# Patient Record
Sex: Female | Born: 1945 | Race: White | Hispanic: No | Marital: Married | Smoking: Current every day smoker
Health system: Southern US, Community
[De-identification: ages and names within clinical notes are randomized; demographics above are authoritative.]

## PROBLEM LIST (undated history)

## (undated) DIAGNOSIS — E079 Disorder of thyroid, unspecified: Secondary | ICD-10-CM

## (undated) DIAGNOSIS — E785 Hyperlipidemia, unspecified: Secondary | ICD-10-CM

## (undated) DIAGNOSIS — K219 Gastro-esophageal reflux disease without esophagitis: Secondary | ICD-10-CM

## (undated) DIAGNOSIS — Z8639 Personal history of other endocrine, nutritional and metabolic disease: Secondary | ICD-10-CM

## (undated) DIAGNOSIS — F419 Anxiety disorder, unspecified: Secondary | ICD-10-CM

## (undated) DIAGNOSIS — J449 Chronic obstructive pulmonary disease, unspecified: Secondary | ICD-10-CM

## (undated) HISTORY — DX: Disorder of thyroid, unspecified: E07.9

## (undated) HISTORY — DX: Personal history of other endocrine, nutritional and metabolic disease: Z86.39

## (undated) HISTORY — PX: VAGINAL HYSTERECTOMY: SUR661

## (undated) HISTORY — DX: Hyperlipidemia, unspecified: E78.5

## (undated) HISTORY — PX: EXPLORATORY LAPAROTOMY: SUR591

## (undated) HISTORY — DX: Anxiety disorder, unspecified: F41.9

## (undated) HISTORY — PX: AUGMENTATION MAMMAPLASTY: SUR837

---

## 1998-02-09 ENCOUNTER — Ambulatory Visit (HOSPITAL_COMMUNITY): Admission: RE | Admit: 1998-02-09 | Discharge: 1998-02-10 | Payer: Self-pay | Admitting: Specialist

## 1998-07-18 HISTORY — PX: CERVICAL SPINE SURGERY: SHX589

## 2004-01-01 ENCOUNTER — Encounter: Admission: RE | Admit: 2004-01-01 | Discharge: 2004-01-01 | Payer: Self-pay | Admitting: Family Medicine

## 2004-06-16 ENCOUNTER — Ambulatory Visit: Payer: Self-pay | Admitting: Family Medicine

## 2004-06-21 ENCOUNTER — Ambulatory Visit: Payer: Self-pay | Admitting: Family Medicine

## 2004-06-26 ENCOUNTER — Ambulatory Visit: Payer: Self-pay | Admitting: Family Medicine

## 2004-11-01 ENCOUNTER — Ambulatory Visit: Payer: Self-pay | Admitting: Family Medicine

## 2005-03-30 ENCOUNTER — Ambulatory Visit: Payer: Self-pay | Admitting: Family Medicine

## 2005-08-23 ENCOUNTER — Ambulatory Visit: Payer: Self-pay | Admitting: Family Medicine

## 2005-08-31 ENCOUNTER — Ambulatory Visit: Payer: Self-pay | Admitting: Family Medicine

## 2005-11-16 ENCOUNTER — Ambulatory Visit: Payer: Self-pay | Admitting: Family Medicine

## 2007-02-13 DIAGNOSIS — E039 Hypothyroidism, unspecified: Secondary | ICD-10-CM | POA: Insufficient documentation

## 2007-02-26 ENCOUNTER — Ambulatory Visit: Payer: Self-pay | Admitting: Family Medicine

## 2007-02-28 LAB — CONVERTED CEMR LAB
ALT: 12 units/L (ref 0–35)
AST: 18 units/L (ref 0–37)
Albumin: 4.1 g/dL (ref 3.5–5.2)
Alkaline Phosphatase: 55 units/L (ref 39–117)
Basophils Absolute: 0 10*3/uL (ref 0.0–0.1)
Bilirubin, Direct: 0.1 mg/dL (ref 0.0–0.3)
Direct LDL: 144.2 mg/dL
Eosinophils Absolute: 0.5 10*3/uL (ref 0.0–0.6)
HCT: 36.6 % (ref 36.0–46.0)
Hemoglobin: 12.8 g/dL (ref 12.0–15.0)
Monocytes Absolute: 0.8 10*3/uL — ABNORMAL HIGH (ref 0.2–0.7)
Neutro Abs: 4 10*3/uL (ref 1.4–7.7)
Neutrophils Relative %: 40.9 % — ABNORMAL LOW (ref 43.0–77.0)
Platelets: 245 10*3/uL (ref 150–400)
RBC: 3.92 M/uL (ref 3.87–5.11)
RDW: 11.7 % (ref 11.5–14.6)
Total Bilirubin: 1.1 mg/dL (ref 0.3–1.2)
Total CHOL/HDL Ratio: 4.6
Total Protein: 6.3 g/dL (ref 6.0–8.3)
Triglycerides: 88 mg/dL (ref 0–149)
VLDL: 18 mg/dL (ref 0–40)

## 2007-07-11 ENCOUNTER — Ambulatory Visit: Payer: Self-pay | Admitting: Internal Medicine

## 2007-07-18 ENCOUNTER — Ambulatory Visit: Payer: Self-pay | Admitting: Family Medicine

## 2007-08-31 ENCOUNTER — Telehealth: Payer: Self-pay | Admitting: Family Medicine

## 2008-01-02 ENCOUNTER — Ambulatory Visit: Payer: Self-pay | Admitting: Family Medicine

## 2008-01-04 LAB — CONVERTED CEMR LAB: TSH: 0.1 microintl units/mL — ABNORMAL LOW (ref 0.35–5.50)

## 2008-01-25 ENCOUNTER — Telehealth: Payer: Self-pay | Admitting: Family Medicine

## 2008-04-21 ENCOUNTER — Ambulatory Visit: Payer: Self-pay | Admitting: Family Medicine

## 2009-01-26 ENCOUNTER — Ambulatory Visit: Payer: Self-pay | Admitting: Family Medicine

## 2009-01-26 DIAGNOSIS — N318 Other neuromuscular dysfunction of bladder: Secondary | ICD-10-CM | POA: Insufficient documentation

## 2009-01-26 DIAGNOSIS — G4700 Insomnia, unspecified: Secondary | ICD-10-CM | POA: Insufficient documentation

## 2009-01-28 LAB — CONVERTED CEMR LAB
ALT: 10 units/L (ref 0–35)
AST: 17 units/L (ref 0–37)
BUN: 9 mg/dL (ref 6–23)
Bilirubin, Direct: 0.1 mg/dL (ref 0.0–0.3)
CO2: 31 meq/L (ref 19–32)
Cholesterol: 224 mg/dL — ABNORMAL HIGH (ref 0–200)
Direct LDL: 159 mg/dL
Eosinophils Absolute: 0.4 10*3/uL (ref 0.0–0.7)
Eosinophils Relative: 4.5 % (ref 0.0–5.0)
HDL: 45.9 mg/dL (ref >39.00)
Lymphocytes Relative: 36.4 % (ref 12.0–46.0)
MCV: 93.4 fL (ref 78.0–100.0)
Monocytes Absolute: 0.4 10*3/uL (ref 0.1–1.0)
Monocytes Relative: 5.1 % (ref 3.0–12.0)
Neutrophils Relative %: 53.2 % (ref 43.0–77.0)
Potassium: 4.4 meq/L (ref 3.5–5.1)
RDW: 11.7 % (ref 11.5–14.6)
Sodium: 144 meq/L (ref 135–145)
TSH: 0.24 microintl units/mL — ABNORMAL LOW (ref 0.35–5.50)
Total Bilirubin: 1.3 mg/dL — ABNORMAL HIGH (ref 0.3–1.2)
Triglycerides: 119 mg/dL (ref 0.0–149.0)

## 2009-04-28 ENCOUNTER — Ambulatory Visit: Payer: Self-pay | Admitting: Family Medicine

## 2009-04-28 DIAGNOSIS — E785 Hyperlipidemia, unspecified: Secondary | ICD-10-CM | POA: Insufficient documentation

## 2009-04-28 DIAGNOSIS — K219 Gastro-esophageal reflux disease without esophagitis: Secondary | ICD-10-CM | POA: Insufficient documentation

## 2009-05-01 LAB — CONVERTED CEMR LAB
AST: 17 units/L (ref 0–37)
Bilirubin, Direct: 0.1 mg/dL (ref 0.0–0.3)
Cholesterol: 144 mg/dL (ref 0–200)
HDL: 40.7 mg/dL (ref >39.00)
Total CHOL/HDL Ratio: 4
Triglycerides: 69 mg/dL (ref 0.0–149.0)
VLDL: 13.8 mg/dL (ref 0.0–40.0)

## 2009-08-11 ENCOUNTER — Ambulatory Visit: Payer: Self-pay | Admitting: Family Medicine

## 2009-08-14 ENCOUNTER — Telehealth: Payer: Self-pay | Admitting: Family Medicine

## 2010-01-25 ENCOUNTER — Ambulatory Visit: Payer: Self-pay | Admitting: Family Medicine

## 2010-02-01 LAB — CONVERTED CEMR LAB
ALT: 14 units/L (ref 0–35)
Albumin: 4.3 g/dL (ref 3.5–5.2)
Alkaline Phosphatase: 65 units/L (ref 39–117)
BUN: 15 mg/dL (ref 6–23)
Basophils Relative: 0.5 % (ref 0.0–3.0)
Bilirubin, Direct: 0.2 mg/dL (ref 0.0–0.3)
Chloride: 107 meq/L (ref 96–112)
Creatinine, Ser: 0.7 mg/dL (ref 0.4–1.2)
Eosinophils Absolute: 0.3 10*3/uL (ref 0.0–0.7)
GFR calc non Af Amer: 96.31 mL/min (ref >60)
Glucose, Bld: 97 mg/dL (ref 70–99)
HCT: 39.4 % (ref 36.0–46.0)
HDL: 51.1 mg/dL (ref >39.00)
MCV: 94.8 fL (ref 78.0–100.0)
Neutrophils Relative %: 50.5 % (ref 43.0–77.0)
Platelets: 267 10*3/uL (ref 150.0–400.0)
RBC: 4.16 M/uL (ref 3.87–5.11)
Sodium: 144 meq/L (ref 135–145)
Total Bilirubin: 1 mg/dL (ref 0.3–1.2)
Total Protein: 6.9 g/dL (ref 6.0–8.3)
WBC: 7 10*3/uL (ref 4.5–10.5)

## 2010-05-25 ENCOUNTER — Ambulatory Visit: Payer: Self-pay | Admitting: Family Medicine

## 2010-05-25 DIAGNOSIS — M25519 Pain in unspecified shoulder: Secondary | ICD-10-CM | POA: Insufficient documentation

## 2010-08-19 NOTE — Assessment & Plan Note (Signed)
Summary: fup--will fast//ccm/pt rescd//ccm   Vital Signs:  Patient profile:   65 year old female Weight:      120 pounds BMI:     22.03 BP sitting:   108 / 64  (left arm) Cuff size:   regular  Vitals Entered By: Raechel Ache, RN (January 25, 2010 8:45 AM) CC: ROV, fasting.   History of Present Illness: here to get fasting labs and to ask about weight. She feels like she has gained some weight in the past 6 months because her clothes feel tight, but according to our scales her weight has not changed at all. She feels fine, exercises regularly.   Allergies: 1)  ! Lodine 2)  ! Celebrex  Past History:  Past Medical History: Reviewed history from 04/28/2009 and no changes required. Hypothyroidism DJD insomnia Hyperlipidemia  Review of Systems  The patient denies anorexia, fever, weight loss, weight gain, vision loss, decreased hearing, hoarseness, chest pain, syncope, dyspnea on exertion, peripheral edema, prolonged cough, headaches, hemoptysis, abdominal pain, melena, hematochezia, severe indigestion/heartburn, hematuria, incontinence, genital sores, muscle weakness, suspicious skin lesions, transient blindness, difficulty walking, depression, unusual weight change, abnormal bleeding, enlarged lymph nodes, angioedema, breast masses, and testicular masses.    Physical Exam  General:  Well-developed,well-nourished,in no acute distress; alert,appropriate and cooperative throughout examination Neck:  No deformities, masses, or tenderness noted. Lungs:  Normal respiratory effort, chest expands symmetrically. Lungs are clear to auscultation, no crackles or wheezes. Heart:  Normal rate and regular rhythm. S1 and S2 normal without gallop, murmur, click, rub or other extra sounds.   Impression & Recommendations:  Problem # 1:  HYPERLIPIDEMIA (ICD-272.4)  Her updated medication list for this problem includes:    Simvastatin 40 Mg Tabs (Simvastatin) .Marland Kitchen... Take one tablet at  bedtime  Orders: Venipuncture ([address]) TLB-Lipid Panel (80061-LIPID) TLB-BMP (Basic Metabolic Panel-BMET) (80048-METABOL) TLB-CBC Platelet - w/Differential (85025-CBCD) TLB-Hepatic/Liver Function Pnl (80076-HEPATIC) TLB-TSH (Thyroid Stimulating Hormone) (84443-TSH)  Problem # 2:  HYPOTHYROIDISM (ICD-244.9)  Her updated medication list for this problem includes:    Synthroid 100 Mcg Tabs (Levothyroxine sodium) .Marland Kitchen... 1 by mouth once daily  Problem # 3:  GERD (ICD-530.81)  Her updated medication list for this problem includes:    Prevacid 24hr 15 Mg Cpdr (Lansoprazole) ..... Once daily  Complete Medication List: 1)  Synthroid 100 Mcg Tabs (Levothyroxine sodium) .Marland Kitchen.. 1 by mouth once daily 2)  Calcium 500/d 500-400 Mg-unit Chew (Calcium-vitamin d) .... Once daily 3)  Bl Garlic 400 Mg Tabs (Garlic-calcium) .... Two times a day 4)  Simvastatin 40 Mg Tabs (Simvastatin) .... Take one tablet at bedtime 5)  Prevacid 24hr 15 Mg Cpdr (Lansoprazole) .... Once daily 6)  Tussionex Pennkinetic Er 8-10 Mg/80ml Lqcr (Chlorpheniramine-hydrocodone) .Marland Kitchen.. 1 tsp two times a day as needed cough  Patient Instructions: 1)  get labs Prescriptions: SIMVASTATIN 40 MG TABS (SIMVASTATIN) Take one tablet at bedtime  #30 x 11   Entered and Authorized by:   Nelwyn Salisbury MD   Signed by:   Nelwyn Salisbury MD on 01/25/2010   Method used:   Electronically to        [facility] (retail)       [address]       [place], [place]  [address]       Ph: [phone] or [phone]       Fax: [phone]   RxID:   [id number] SYNTHROID 100 MCG  TABS (LEVOTHYROXINE SODIUM) 1 by mouth once daily  #  30 x 11   Entered and Authorized by:   Nelwyn Salisbury MD   Signed by:   Nelwyn Salisbury MD on 01/25/2010   Method used:   [facility] to        [facility] (retail)       [address]       [place], [place]  [address]       Ph: [phone] or [phone]       Fax: [phone]    RxID:   [id number]

## 2010-08-19 NOTE — Assessment & Plan Note (Signed)
Summary: CHILLS,CONGESTION // RS   Vital Signs:  Patient profile:   65 year old female Weight:      120 pounds Temp:     100.1 degrees F oral BP sitting:   130 / 82  (left arm) Cuff size:   regular  Vitals Entered By: Alfred Levins, CMA (August 11, 2009 10:41 AM) CC: fever, chills, cough, congestion, sneezing, diarrhea, no appetite x4 days   History of Present Illness: Here for 4 days of fever, HA, chest congestion, and a dry cough. Has some diarrhea but no vomitting. On fluids, Tylenol, and Mucinex.   Allergies: 1)  ! Lodine 2)  ! Celebrex  Past History:  Past Medical History: Reviewed history from 04/28/2009 and no changes required. Hypothyroidism DJD insomnia Hyperlipidemia  Review of Systems  The patient denies anorexia, weight loss, weight gain, vision loss, decreased hearing, hoarseness, chest pain, syncope, dyspnea on exertion, peripheral edema, hemoptysis, abdominal pain, melena, hematochezia, severe indigestion/heartburn, hematuria, incontinence, genital sores, muscle weakness, suspicious skin lesions, transient blindness, difficulty walking, depression, unusual weight change, abnormal bleeding, enlarged lymph nodes, angioedema, breast masses, and testicular masses.    Physical Exam  General:  alert, comfortable but coughing  Head:  Normocephalic and atraumatic without obvious abnormalities. No apparent alopecia or balding. Eyes:  No corneal or conjunctival inflammation noted. EOMI. Perrla. Funduscopic exam benign, without hemorrhages, exudates or papilledema. Vision grossly normal. Ears:  External ear exam shows no significant lesions or deformities.  Otoscopic examination reveals clear canals, tympanic membranes are intact bilaterally without bulging, retraction, inflammation or discharge. Hearing is grossly normal bilaterally. Nose:  External nasal examination shows no deformity or inflammation. Nasal mucosa are pink and moist without lesions or exudates. Mouth:   Oral mucosa and oropharynx without lesions or exudates.  Teeth in good repair. Neck:  No deformities, masses, or tenderness noted. Lungs:  scattered rhonchi only   Impression & Recommendations:  Problem # 1:  ACUTE BRONCHITIS (ICD-466.0)  Her updated medication list for this problem includes:    Tussionex Pennkinetic Er 8-10 Mg/37ml Lqcr (Chlorpheniramine-hydrocodone) .Marland Kitchen... 1 tsp two times a day as needed cough    Augmentin 875-125 Mg Tabs (Amoxicillin-pot clavulanate) .Marland Kitchen..Marland Kitchen Two times a day  Complete Medication List: 1)  Synthroid 100 Mcg Tabs (Levothyroxine sodium) .Marland Kitchen.. 1 by mouth once daily 2)  Calcium 500/d 500-400 Mg-unit Chew (Calcium-vitamin d) .... Once daily 3)  Bl Garlic 400 Mg Tabs (Garlic-calcium) .... Two times a day 4)  Simvastatin 40 Mg Tabs (Simvastatin) .... Take one tablet at bedtime 5)  Prevacid 24hr 15 Mg Cpdr (Lansoprazole) .... Once daily 6)  Tussionex Pennkinetic Er 8-10 Mg/23ml Lqcr (Chlorpheniramine-hydrocodone) .Marland Kitchen.. 1 tsp two times a day as needed cough 7)  Augmentin 875-125 Mg Tabs (Amoxicillin-pot clavulanate) .... Two times a day  Patient Instructions: 1)  Please schedule a follow-up appointment as needed .  Prescriptions: AUGMENTIN 875-125 MG TABS (AMOXICILLIN-POT CLAVULANATE) two times a day  #20 x 0   Entered and Authorized by:   Nelwyn Salisbury MD   Signed by:   Nelwyn Salisbury MD on 08/11/2009   Method used:   Print then Give to Patient   RxID:   [id number] TUSSIONEX PENNKINETIC ER 8-10 MG/5ML LQCR (CHLORPHENIRAMINE-HYDROCODONE) 1 tsp two times a day as needed cough  #240 x 0   Entered and Authorized by:   Nelwyn Salisbury MD   Signed by:   Nelwyn Salisbury MD on 08/11/2009   Method used:   Print then Give  to Patient   RxID:   [id number]

## 2010-08-19 NOTE — Assessment & Plan Note (Signed)
Summary: R ARM PAIN (BETWEEN ELBOW AND SHOULDER) // RS   Vital Signs:  Patient profile:   65 year old female Weight:      125 pounds O2 Sat:      97 % Temp:     99.8 degrees F Pulse rate:   86 / minute BP sitting:   120 / 80  (left arm) Cuff size:   regular  Vitals Entered By: Pura Spice, RN (May 25, 2010 8:16 AM) CC: pain rt arm sharp twinges and noticed fingers rt hand goes numb   History of Present Illness: Here for 2 problems. First she has a lot of trouble sleeping. She tosses and turns at night and can't relax. Second, she has had sharp pains in the anterior right shoulder which radiate down the right arm for about 3 months. She often gets numb in the right hand as well. No neck pain. Advil does not help.   Preventive Screening-Counseling & Management  Alcohol-Tobacco     Smoking Status: current     Packs/Day: 0.5     Year Started: 1965  Allergies: 1)  ! Lodine 2)  ! Celebrex  Past History:  Past Medical History: Reviewed history from 04/28/2009 and no changes required. Hypothyroidism DJD insomnia Hyperlipidemia  Past Surgical History: Reviewed history from 01/02/2008 and no changes required. Tonsillectomy surgery to remove a coin she swallowed at age 66 years vaginal hysterectomy with ovaries intact  Social History: Packs/Day:  0.5  Review of Systems  The patient denies anorexia, fever, weight loss, weight gain, vision loss, decreased hearing, hoarseness, chest pain, syncope, dyspnea on exertion, peripheral edema, prolonged cough, headaches, hemoptysis, abdominal pain, melena, hematochezia, severe indigestion/heartburn, hematuria, incontinence, genital sores, muscle weakness, suspicious skin lesions, transient blindness, difficulty walking, depression, unusual weight change, abnormal bleeding, enlarged lymph nodes, angioedema, breast masses, and testicular masses.    Physical Exam  General:  Well-developed,well-nourished,in no acute distress;  alert,appropriate and cooperative throughout examination Extremities:  the anterior right shoulder is very tender, and her ROM is very limited by pain. No crepitus.  Neurologic:  strength normal in all extremities and sensation intact to light touch.     Impression & Recommendations:  Problem # 1:  SHOULDER PAIN, RIGHT (ICD-719.41)  Her updated medication list for this problem includes:    Vicodin Hp 10-660 Mg Tabs (Hydrocodone-acetaminophen) .Marland Kitchen... 1 q 6 hours as needed pain  Problem # 2:  INSOMNIA (ICD-780.52)  Her updated medication list for this problem includes:    Temazepam 30 Mg Caps (Temazepam) ..... One at bedtime  Complete Medication List: 1)  Synthroid 100 Mcg Tabs (Levothyroxine sodium) .Marland Kitchen.. 1 by mouth once daily 2)  Calcium 500/d 500-400 Mg-unit Chew (Calcium-vitamin d) .... Once daily 3)  Simvastatin 40 Mg Tabs (Simvastatin) .... Take one tablet at bedtime 4)  Prevacid 24hr 15 Mg Cpdr (Lansoprazole) .... Once daily 5)  Vicodin Hp 10-660 Mg Tabs (Hydrocodone-acetaminophen) .Marland Kitchen.. 1 q 6 hours as needed pain 6)  Temazepam 30 Mg Caps (Temazepam) .... 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 Use Vicodin for relief. Will refer her to Orthopedics for further evaluation. Try Temazepam at night.  Prescriptions: TEMAZEPAM 30 MG CAPS (TEMAZEPAM) one at bedtime  #30 x 5   Entered and Authorized by:   Nelwyn Salisbury MD   Signed by:   Nelwyn Salisbury MD on 05/25/2010   Method used:   Print then Give to  Patient   RxID:   [id number] VICODIN HP 10-660 MG TABS (HYDROCODONE-ACETAMINOPHEN) 1 q 6 hours as needed pain  #60 x 0   Entered and Authorized by:   Nelwyn Salisbury MD   Signed by:   Nelwyn Salisbury MD on 05/25/2010   Method used:   Print then Give to Patient   RxID:   [id number]    Orders Added: 1)  Est. Patient Level IV [02725]  Appended Document: orthropedic referral rt arm pain    Clinical Lists  Changes  Orders: Added new Referral order of Orthopedic Referral (Ortho) - Signed

## 2010-08-19 NOTE — Progress Notes (Signed)
Summary: diarrhea  Phone Note Call from Patient   Caller: Patient Call For: Nelwyn Salisbury MD Summary of Call: Pt is having 4 BM daily that is diarrhea.  Feeling better otherwise. [phone] Has 6 more days left of Augmentin. Initial call taken by: Lynann Beaver CMA,  August 14, 2009 3:32 PM  Follow-up for Phone Call        try to finish out the antibiotics. Use Imodium OTC as needed  Follow-up by: Nelwyn Salisbury MD,  August 14, 2009 5:02 PM  Additional Follow-up for Phone Call Additional follow up Details #1::        Phone Call Completed Additional Follow-up by: Alfred Levins, CMA,  August 14, 2009 5:03 PM

## 2010-10-04 ENCOUNTER — Ambulatory Visit (INDEPENDENT_AMBULATORY_CARE_PROVIDER_SITE_OTHER): Payer: BC Managed Care – PPO | Admitting: Family Medicine

## 2010-10-04 ENCOUNTER — Encounter: Payer: Self-pay | Admitting: Family Medicine

## 2010-10-04 VITALS — BP 130/78 | Temp 99.0°F | Wt 117.0 lb

## 2010-10-04 DIAGNOSIS — R0789 Other chest pain: Secondary | ICD-10-CM

## 2010-10-04 NOTE — Patient Instructions (Signed)
Follow up immediately for any shortness of breath or any chest pain. Consider baby aspirin one daily.

## 2010-10-04 NOTE — Progress Notes (Signed)
  Subjective:    Patient ID: Brooke Combs, female    DOB: 05-18-1946, 64 y.o.   MRN: [id number]  HPI  Patient seen as a work in with symptoms off and on since December related to chest. She relates in December bending over to lift something and felt a sharp chest pain. That pain improved after several hours after taking some hydrocodone. She's had a couple episodes since then of sensation of somewhat irregular pulse but no real chest pain. Denies any chest tightness. She walks several days per week for exercise and has not had any exertional symptoms. She is somewhat of a poor historian and has some difficulty describing her symptoms.  Some increase in fatigue recently with activity.  Does have risk factors including history of hyperlipidemia treated with simvastatin , age , and ongoing nicotine use. No history of diabetes. No hypertension. Denies any reflux symptoms.   She has current stressor of going through divorce. Denies depression. Good family support.   Review of Systems  Constitutional: Positive for fatigue. Negative for fever, appetite change and unexpected weight change.  HENT: Negative for neck pain.   Respiratory: Negative for cough and wheezing.   Cardiovascular: Negative for leg swelling.  Gastrointestinal: Negative for abdominal pain.  Genitourinary: Negative for flank pain.  Skin: Negative for rash.  Neurological: Negative for syncope and headaches.  Psychiatric/Behavioral: Negative for dysphoric mood.       Objective:   Physical Exam  patient is alert and in no distress. Vital signs as recorded and stable Neck supple no mass Chest good auscultation Heart regular rhythm and rate Extremities no edema  EKG shows sinus rhythm and no acute changes       Assessment & Plan:   atypical chest symptoms. Patient has risk factors including age, nicotine use and history of hyperlipidemia. We have suggested consideration of stress testing given her moderate risk factors  and given her symptoms, though somewhat atypical. She is reluctant at this time. We have suggested consider baby aspirin one per day. We've recommended strongly that she consider stress test if she has any shortness of breath.  She is strongly encouraged to stop smoking.

## 2010-11-03 ENCOUNTER — Ambulatory Visit (INDEPENDENT_AMBULATORY_CARE_PROVIDER_SITE_OTHER): Payer: BC Managed Care – PPO | Admitting: Family Medicine

## 2010-11-03 ENCOUNTER — Encounter: Payer: Self-pay | Admitting: Family Medicine

## 2010-11-03 VITALS — BP 132/80 | HR 98 | Temp 98.8°F

## 2010-11-03 DIAGNOSIS — F411 Generalized anxiety disorder: Secondary | ICD-10-CM

## 2010-11-03 DIAGNOSIS — F419 Anxiety disorder, unspecified: Secondary | ICD-10-CM

## 2010-11-03 DIAGNOSIS — B370 Candidal stomatitis: Secondary | ICD-10-CM

## 2010-11-03 MED ORDER — MAGIC MOUTHWASH: 5.0000 mL | Freq: Four times a day (QID) | ORAL | Status: DC | PRN

## 2010-11-03 MED ORDER — LORAZEPAM 0.5 MG PO TABS: 0.5000 mg | ORAL_TABLET | Freq: Four times a day (QID) | ORAL | Status: DC | PRN

## 2010-11-03 NOTE — Progress Notes (Signed)
  Subjective:    Patient ID: Brooke Combs, female    DOB: 16-Oct-1945, 65 y.o.   MRN: [id number]  HPI Here for several days of soreness on the tongue. No ST or fever. She had thrush a year ago, and this seems to be the same thing. No recent antibiotic use, but she has been under a lot of stress lately. She is going  through a divorce now, and her nerves are always on edge. She sleeps well with Temazpam, but asks if she could take something during the day to relax.    Review of Systems  Constitutional: Negative.   HENT: Negative.   Respiratory: Negative.   Psychiatric/Behavioral: The patient is nervous/anxious.        Objective:   Physical Exam  Constitutional: She appears well-developed and well-nourished.  HENT:  Mouth/Throat: Oropharynx is clear and moist. No oropharyngeal exudate.  Neck: Normal range of motion. Neck supple.  Lymphadenopathy:    She has no cervical adenopathy.  Psychiatric: She has a normal mood and affect. Her behavior is normal. Thought content normal.          Assessment & Plan:  Use Magic Mouthwash for the thrush. Try Lorazepam for anxiety.

## 2010-11-08 ENCOUNTER — Ambulatory Visit: Payer: BC Managed Care – PPO | Admitting: Family Medicine

## 2010-12-16 ENCOUNTER — Telehealth: Payer: Self-pay | Admitting: *Deleted

## 2010-12-16 NOTE — Telephone Encounter (Signed)
Pt is asking for Temazepam refills.?????

## 2010-12-17 MED ORDER — TEMAZEPAM 30 MG PO CAPS: 30.0000 mg | ORAL_CAPSULE | Freq: Every evening | ORAL | Status: DC | PRN

## 2010-12-17 NOTE — Telephone Encounter (Signed)
Pharmacy is calling for refill on Temazepam. Sent to Dr. Claris Che voice mail.

## 2010-12-17 NOTE — Telephone Encounter (Signed)
Call in #30 with 5 rf 

## 2010-12-17 NOTE — Telephone Encounter (Signed)
rx sent to pharmacy and pt is aware. 

## 2011-01-04 ENCOUNTER — Ambulatory Visit (INDEPENDENT_AMBULATORY_CARE_PROVIDER_SITE_OTHER): Payer: BC Managed Care – PPO | Admitting: Family Medicine

## 2011-01-04 ENCOUNTER — Encounter: Payer: Self-pay | Admitting: Family Medicine

## 2011-01-04 VITALS — BP 124/84 | HR 76 | Temp 98.9°F | Ht 64.0 in | Wt 119.0 lb

## 2011-01-04 DIAGNOSIS — F419 Anxiety disorder, unspecified: Secondary | ICD-10-CM

## 2011-01-04 DIAGNOSIS — F411 Generalized anxiety disorder: Secondary | ICD-10-CM

## 2011-01-04 DIAGNOSIS — E039 Hypothyroidism, unspecified: Secondary | ICD-10-CM

## 2011-01-04 DIAGNOSIS — E785 Hyperlipidemia, unspecified: Secondary | ICD-10-CM

## 2011-01-04 LAB — BASIC METABOLIC PANEL
Calcium: 10.1 mg/dL (ref 8.4–10.5)
Creatinine, Ser: 0.7 mg/dL (ref 0.4–1.2)
Potassium: 6.1 mEq/L (ref 3.5–5.1)
Sodium: 145 mEq/L (ref 135–145)

## 2011-01-04 LAB — HEPATIC FUNCTION PANEL
Albumin: 4.7 g/dL (ref 3.5–5.2)
Alkaline Phosphatase: 71 U/L (ref 39–117)
Bilirubin, Direct: 0.2 mg/dL (ref 0.0–0.3)
Total Protein: 6.9 g/dL (ref 6.0–8.3)

## 2011-01-04 LAB — POCT URINALYSIS DIPSTICK
Blood, UA: NEGATIVE
Glucose, UA: NEGATIVE
Ketones, UA: NEGATIVE
Urobilinogen, UA: 0.2
pH, UA: 5.5

## 2011-01-04 LAB — TSH: TSH: 0.09 u[IU]/mL — ABNORMAL LOW (ref 0.35–5.50)

## 2011-01-04 LAB — LIPID PANEL
Cholesterol: 172 mg/dL (ref 0–200)
HDL: 47.2 mg/dL (ref >39.00)
Total CHOL/HDL Ratio: 4

## 2011-01-04 LAB — CBC WITH DIFFERENTIAL/PLATELET
Basophils Relative: 0.5 % (ref 0.0–3.0)
Lymphs Abs: 2.9 10*3/uL (ref 0.7–4.0)
MCV: 95 fl (ref 78.0–100.0)
Monocytes Absolute: 0.6 10*3/uL (ref 0.1–1.0)
Monocytes Relative: 7.6 % (ref 3.0–12.0)
Neutro Abs: 3.5 10*3/uL (ref 1.4–7.7)
RBC: 4.29 Mil/uL (ref 3.87–5.11)

## 2011-01-04 NOTE — Progress Notes (Signed)
  Subjective:    Patient ID: Brooke Combs, female    DOB: 30-Aug-1945, 65 y.o.   MRN: [id number]  HPI Here to follow up on anxiety, insomnia, lipids, and thyroid. She feels well, uses Lorazepam periodically. Sleeps well.    Review of Systems  Constitutional: Negative.   Respiratory: Negative.   Cardiovascular: Negative.   Psychiatric/Behavioral: Negative.        Objective:   Physical Exam  Constitutional: She appears well-developed and well-nourished.  Neck: No thyromegaly present.  Psychiatric: She has a normal mood and affect. Her behavior is normal. Thought content normal.          Assessment & Plan:  Get labs today

## 2011-01-07 ENCOUNTER — Telehealth: Payer: Self-pay | Admitting: *Deleted

## 2011-01-07 NOTE — Telephone Encounter (Signed)
Pt aware of this and she doesn't want to lower it again because this happened one time before. She states that she felt bad afterwards. Pt is going to think about this and call back next week to let us know what she wants to do.

## 2011-01-07 NOTE — Telephone Encounter (Signed)
Message copied by Romualdo Bolk on Fri Jan 07, 2011 10:50 AM ------      Message from: Gershon Crane A      Created: Thu Jan 06, 2011  9:05 PM       Normal except her Synthroid dose is too high. Decrease this to 75 mcg daily. Call in one year supply.Recheck a TSH in 90 days

## 2011-01-07 NOTE — Telephone Encounter (Signed)
noted 

## 2011-01-10 ENCOUNTER — Telehealth: Payer: Self-pay

## 2011-01-10 NOTE — Telephone Encounter (Signed)
Pt called and stated she had received a call last week about lowering her thyroid medication and she has decided not to lower the medication and stay on her current dosage.  Pt also stated that at her last visit she and Dr. Clent Ridges discussed increasing her dosage of lorazepam 1 mg---pls advise

## 2011-01-11 MED ORDER — LORAZEPAM 1 MG PO TABS: 1.0000 mg | ORAL_TABLET | Freq: Four times a day (QID) | ORAL | Status: DC | PRN

## 2011-01-11 NOTE — Telephone Encounter (Signed)
It is okay to keep the Synthroid dose where it is. Call in Lorazepam 1 mg q 6 hours prn anxiety, #60 with 5 rf. Also cancel all refills for the 0.5 mg dose

## 2011-02-02 ENCOUNTER — Telehealth: Payer: Self-pay | Admitting: Family Medicine

## 2011-02-02 NOTE — Telephone Encounter (Signed)
Pt stated that she still has some thrush in mouth that has not completely gone away. She would like a script called in to pharmacy, but does not want the thick mouthwash that she tried last time. Can we order a thin type of mouthwash?

## 2011-02-03 NOTE — Telephone Encounter (Signed)
Call in Nystatin oral suspension, use 5 ml to swish and spit 4 times a day prn, 300 ml with 2 rf

## 2011-02-03 NOTE — Telephone Encounter (Signed)
I called in script and pt aware. 

## 2011-02-15 ENCOUNTER — Other Ambulatory Visit: Payer: Self-pay | Admitting: Family Medicine

## 2011-03-22 ENCOUNTER — Encounter: Payer: Self-pay | Admitting: Family Medicine

## 2011-03-22 ENCOUNTER — Ambulatory Visit (INDEPENDENT_AMBULATORY_CARE_PROVIDER_SITE_OTHER): Payer: BC Managed Care – PPO | Admitting: Family Medicine

## 2011-03-22 VITALS — BP 116/70 | HR 85 | Temp 99.0°F | Wt 117.0 lb

## 2011-03-22 DIAGNOSIS — J40 Bronchitis, not specified as acute or chronic: Secondary | ICD-10-CM

## 2011-03-22 MED ORDER — AZITHROMYCIN 250 MG PO TABS: ORAL_TABLET | ORAL | Status: AC

## 2011-03-22 MED ORDER — HYDROCOD POLST-CHLORPHEN POLST 10-8 MG/5ML PO LQCR: 5.0000 mL | Freq: Two times a day (BID) | ORAL | Status: DC | PRN

## 2011-03-22 NOTE — Progress Notes (Signed)
  Subjective:    Patient ID: Brooke Combs, female    DOB: 06/18/1946, 65 y.o.   MRN: [id number]  HPI Here for 2 days of chest burning and a dry cough. No fever or SOB. On fluids and Mucinex.    Review of Systems  Constitutional: Negative.   HENT: Negative.   Eyes: Negative.   Respiratory: Positive for cough.        Objective:   Physical Exam  Constitutional: She appears well-developed and well-nourished.  HENT:  Right Ear: External ear normal.  Left Ear: External ear normal.  Nose: Nose normal.  Mouth/Throat: Oropharynx is clear and moist. No oropharyngeal exudate.  Eyes: Conjunctivae are normal. Pupils are equal, round, and reactive to light.  Neck: No thyromegaly present.  Pulmonary/Chest: Effort normal and breath sounds normal. No respiratory distress. She has no wheezes. She has no rales. She exhibits no tenderness.  Lymphadenopathy:    She has no cervical adenopathy.          Assessment & Plan:  Rest, fluids

## 2011-06-21 ENCOUNTER — Telehealth: Payer: Self-pay | Admitting: Family Medicine

## 2011-06-21 ENCOUNTER — Encounter: Payer: Self-pay | Admitting: Family Medicine

## 2011-06-21 ENCOUNTER — Ambulatory Visit (INDEPENDENT_AMBULATORY_CARE_PROVIDER_SITE_OTHER): Payer: BC Managed Care – PPO | Admitting: Family Medicine

## 2011-06-21 VITALS — BP 102/72 | HR 84 | Temp 98.6°F | Wt 115.0 lb

## 2011-06-21 DIAGNOSIS — E039 Hypothyroidism, unspecified: Secondary | ICD-10-CM

## 2011-06-21 DIAGNOSIS — M791 Myalgia, unspecified site: Secondary | ICD-10-CM

## 2011-06-21 DIAGNOSIS — IMO0001 Reserved for inherently not codable concepts without codable children: Secondary | ICD-10-CM

## 2011-06-21 DIAGNOSIS — E785 Hyperlipidemia, unspecified: Secondary | ICD-10-CM

## 2011-06-21 LAB — LIPID PANEL
Cholesterol: 176 mg/dL (ref 0–200)
HDL: 56.8 mg/dL (ref >39.00)
LDL Cholesterol: 99 mg/dL (ref 0–99)
Triglycerides: 100 mg/dL (ref 0.0–149.0)
VLDL: 20 mg/dL (ref 0.0–40.0)

## 2011-06-21 LAB — HEPATIC FUNCTION PANEL: Bilirubin, Direct: 0 mg/dL (ref 0.0–0.3)

## 2011-06-21 NOTE — Progress Notes (Signed)
  Subjective:    Patient ID: Brooke Combs, female    DOB: 1946/04/12, 65 y.o.   MRN: [id number]  HPI Here to ask about 2 weeks of diffuse muscle aches, especially in the arms, legs, and shoulders. This feels like it did when she had a reaction to Lipitor. She is fasting for labs.    Review of Systems  Constitutional: Negative.   Respiratory: Negative.   Cardiovascular: Negative.   Musculoskeletal: Positive for myalgias.       Objective:   Physical Exam  Constitutional: She appears well-developed and well-nourished.  Neck: No thyromegaly present.  Cardiovascular: Normal rate, regular rhythm, normal heart sounds and intact distal pulses.   Pulmonary/Chest: Effort normal and breath sounds normal.  Musculoskeletal: Normal range of motion. She exhibits no edema and no tenderness.  Lymphadenopathy:    She has no cervical adenopathy.          Assessment & Plan:  She is probably having myalgias from the Zocor, so I asked her to stop taking this. Check labs including a CK.

## 2011-06-21 NOTE — Telephone Encounter (Signed)
Pt gave new pharmacy name and I put in computer.

## 2011-06-23 ENCOUNTER — Encounter: Payer: Self-pay | Admitting: Family Medicine

## 2011-06-23 NOTE — Progress Notes (Signed)
Quick Note:  Spoke with pt and put a copy of results in mail. ______ 

## 2011-08-03 ENCOUNTER — Telehealth: Payer: Self-pay | Admitting: Family Medicine

## 2011-08-03 NOTE — Telephone Encounter (Signed)
Pt called needs refills on Synthroid and Zocor. She had stopped the Zocor because of weakness in her arms and now she finds out that is was not the medication causing the problem. Per Dr. Clent Ridges it is okay to fill both of these scripts.

## 2011-08-05 MED ORDER — SIMVASTATIN 40 MG PO TABS: 40.0000 mg | ORAL_TABLET | Freq: Every day | ORAL | Status: DC

## 2011-08-05 MED ORDER — LEVOTHYROXINE SODIUM 100 MCG PO TABS: 100.0000 ug | ORAL_TABLET | Freq: Every day | ORAL | Status: DC

## 2011-08-05 NOTE — Telephone Encounter (Signed)
Scripts sent e-scribe 

## 2011-09-05 ENCOUNTER — Other Ambulatory Visit: Payer: Self-pay | Admitting: Family Medicine

## 2011-11-11 ENCOUNTER — Ambulatory Visit (INDEPENDENT_AMBULATORY_CARE_PROVIDER_SITE_OTHER): Payer: Self-pay | Admitting: Family Medicine

## 2011-11-11 ENCOUNTER — Encounter: Payer: Self-pay | Admitting: Family Medicine

## 2011-11-11 VITALS — BP 102/76 | HR 96 | Temp 98.7°F | Wt 117.0 lb

## 2011-11-11 DIAGNOSIS — E039 Hypothyroidism, unspecified: Secondary | ICD-10-CM

## 2011-11-11 DIAGNOSIS — E785 Hyperlipidemia, unspecified: Secondary | ICD-10-CM

## 2011-11-11 LAB — HEPATIC FUNCTION PANEL
ALT: 13 U/L (ref 0–35)
Bilirubin, Direct: 0.1 mg/dL (ref 0.0–0.3)
Total Bilirubin: 0.9 mg/dL (ref 0.3–1.2)
Total Protein: 7.6 g/dL (ref 6.0–8.3)

## 2011-11-11 LAB — TSH: TSH: 0.14 u[IU]/mL — ABNORMAL LOW (ref 0.35–5.50)

## 2011-11-11 LAB — LDL CHOLESTEROL, DIRECT: Direct LDL: 131.5 mg/dL

## 2011-11-11 LAB — LIPID PANEL
Total CHOL/HDL Ratio: 3
VLDL: 19.8 mg/dL (ref 0.0–40.0)

## 2011-11-11 MED ORDER — SIMVASTATIN 40 MG PO TABS: 40.0000 mg | ORAL_TABLET | Freq: Every day | ORAL | Status: DC

## 2011-11-11 MED ORDER — SYNTHROID 100 MCG PO TABS: 100.0000 ug | ORAL_TABLET | Freq: Every day | ORAL | Status: DC

## 2011-11-11 NOTE — Progress Notes (Signed)
  Subjective:    Patient ID: Brooke Combs, female    DOB: 1945/07/29, 66 y.o.   MRN: [id number]  HPI Here to recheck her lipids and thyroid. She has made a lot of dietary changes, and she is eating a much healthier diet. She feels good.   Review of Systems  Constitutional: Negative.   Respiratory: Negative.   Cardiovascular: Negative.        Objective:   Physical Exam  Constitutional: She appears well-developed and well-nourished.  Neck: No thyromegaly present.  Cardiovascular: Normal rate, regular rhythm, normal heart sounds and intact distal pulses.   Pulmonary/Chest: Effort normal and breath sounds normal.  Lymphadenopathy:    She has no cervical adenopathy.          Assessment & Plan:  Get fasting labs

## 2011-11-14 NOTE — Progress Notes (Signed)
Quick Note:  Spoke with pt ______ 

## 2012-01-07 ENCOUNTER — Other Ambulatory Visit: Payer: Self-pay | Admitting: Family Medicine

## 2012-01-30 ENCOUNTER — Telehealth: Payer: Self-pay | Admitting: Family Medicine

## 2012-01-30 MED ORDER — SIMVASTATIN 40 MG PO TABS: 40.0000 mg | ORAL_TABLET | Freq: Every day | ORAL | Status: DC

## 2012-01-30 MED ORDER — SYNTHROID 100 MCG PO TABS: 100.0000 ug | ORAL_TABLET | Freq: Every day | ORAL | Status: DC

## 2012-01-30 NOTE — Telephone Encounter (Signed)
Pt requested a refill on Synthroid & Zocor and I did send e-scribe.

## 2012-04-06 ENCOUNTER — Other Ambulatory Visit: Payer: Self-pay | Admitting: Family Medicine

## 2012-08-20 ENCOUNTER — Telehealth: Payer: Self-pay | Admitting: Family Medicine

## 2012-08-20 NOTE — Telephone Encounter (Signed)
Pt left a voice message and would like a script for Nystatin due to thrush in her mouth.

## 2012-08-21 ENCOUNTER — Other Ambulatory Visit: Payer: Self-pay | Admitting: Family Medicine

## 2012-08-21 NOTE — Telephone Encounter (Signed)
Call in Nystatin oral suspension to take 5 ml and swish in mouth q 4 hours prn, 300 ml with 2 rf

## 2012-08-21 NOTE — Telephone Encounter (Signed)
I sent script e-scribe and spoke with pt. 

## 2012-10-01 ENCOUNTER — Telehealth: Payer: Self-pay | Admitting: Family Medicine

## 2012-10-01 NOTE — Telephone Encounter (Signed)
Yes, per Dr. Fry. 

## 2012-10-01 NOTE — Telephone Encounter (Signed)
Pt needs to see Dr Clent Ridges for swelling on left side of neck w/ a liittle pressure. Lives in [place] and would like to come in at the 11:30 appt.  It is a "same day". Is it ok to schedule?

## 2012-10-01 NOTE — Telephone Encounter (Signed)
appt set/kh 

## 2012-10-05 ENCOUNTER — Ambulatory Visit: Payer: Self-pay | Admitting: Family Medicine

## 2012-11-09 ENCOUNTER — Ambulatory Visit (INDEPENDENT_AMBULATORY_CARE_PROVIDER_SITE_OTHER): Payer: Medicare Other | Admitting: Family Medicine

## 2012-11-09 ENCOUNTER — Encounter: Payer: Self-pay | Admitting: Family Medicine

## 2012-11-09 VITALS — BP 124/72 | HR 96 | Temp 98.3°F | Wt 118.0 lb

## 2012-11-09 DIAGNOSIS — R221 Localized swelling, mass and lump, neck: Secondary | ICD-10-CM

## 2012-11-09 DIAGNOSIS — R220 Localized swelling, mass and lump, head: Secondary | ICD-10-CM

## 2012-11-09 MED ORDER — DIAZEPAM 5 MG PO TABS: 5.0000 mg | ORAL_TABLET | Freq: Three times a day (TID) | ORAL | Status: DC | PRN

## 2012-11-09 NOTE — Progress Notes (Signed)
  Subjective:    Patient ID: Brooke Combs, female    DOB: 09-20-1945, 67 y.o.   MRN: [id number]  HPI Here for a sensation of swelling in the left neck for the past 2 months. There is no pain or discomfort. No ST or trouble swallowing.    Review of Systems  Constitutional: Negative.   HENT: Negative.   Eyes: Negative.   Respiratory: Negative.        Objective:   Physical Exam  Constitutional: She appears well-developed and well-nourished.  HENT:  Head: Normocephalic and atraumatic.  Right Ear: External ear normal.  Left Ear: External ear normal.  Nose: Nose normal.  Mouth/Throat: Oropharynx is clear and moist. No oropharyngeal exudate.  Wears upper and lower dentures   Eyes: Conjunctivae are normal. Pupils are equal, round, and reactive to light.  Neck: Normal range of motion. Neck supple. No tracheal deviation present. No thyromegaly present.  No masses or swelling is noted   Pulmonary/Chest: Effort normal and breath sounds normal.  Lymphadenopathy:    She has no cervical adenopathy.          Assessment & Plan:  Reassured that every thing seems to be okay at this point. She will return if anything changes or if there is any swelling

## 2012-11-30 ENCOUNTER — Ambulatory Visit (INDEPENDENT_AMBULATORY_CARE_PROVIDER_SITE_OTHER): Payer: Medicare Other | Admitting: Family Medicine

## 2012-11-30 ENCOUNTER — Encounter: Payer: Self-pay | Admitting: Family Medicine

## 2012-11-30 VITALS — BP 122/70 | HR 88 | Temp 98.3°F | Wt 118.0 lb

## 2012-11-30 DIAGNOSIS — R220 Localized swelling, mass and lump, head: Secondary | ICD-10-CM

## 2012-11-30 DIAGNOSIS — R221 Localized swelling, mass and lump, neck: Secondary | ICD-10-CM

## 2012-11-30 LAB — CBC WITH DIFFERENTIAL/PLATELET
Basophils Relative: 0.6 % (ref 0.0–3.0)
Eosinophils Absolute: 0.3 10*3/uL (ref 0.0–0.7)
Hemoglobin: 13.6 g/dL (ref 12.0–15.0)
MCHC: 33.7 g/dL (ref 30.0–36.0)
MCV: 93.5 fl (ref 78.0–100.0)
Monocytes Absolute: 0.5 10*3/uL (ref 0.1–1.0)
Neutro Abs: 4.9 10*3/uL (ref 1.4–7.7)
RBC: 4.31 Mil/uL (ref 3.87–5.11)

## 2012-11-30 LAB — BASIC METABOLIC PANEL
CO2: 29 mEq/L (ref 19–32)
Chloride: 108 mEq/L (ref 96–112)
Creatinine, Ser: 0.8 mg/dL (ref 0.4–1.2)
Glucose, Bld: 88 mg/dL (ref 70–99)

## 2012-11-30 NOTE — Progress Notes (Signed)
  Subjective:    Patient ID: Brooke Combs, female    DOB: [birth year], 67 y.o.   MRN: [id number]  HPI Here for 2 and 1/2 months of swelling in the left neck. We looked at this a few weeks ago but could find nothing on exam. She returns today because it feels more swollen than ever, though there is still no pain, no voice changes, and no trouble swallowing. She is very worried about this.    Review of Systems  Constitutional: Negative.   HENT: Negative for sore throat, trouble swallowing, neck pain, neck stiffness and voice change.   Eyes: Negative.   Respiratory: Negative.        Objective:   Physical Exam  Constitutional: She appears well-developed and well-nourished.  HENT:  Right Ear: External ear normal.  Left Ear: External ear normal.  Nose: Nose normal.  Mouth/Throat: Oropharynx is clear and moist. No oropharyngeal exudate.  Eyes: Conjunctivae are normal. Pupils are equal, round, and reactive to light.  Neck: Normal range of motion. Neck supple. No JVD present. No tracheal deviation present. No thyromegaly present.  Pulmonary/Chest: No stridor.  Lymphadenopathy:    She has no cervical adenopathy.          Assessment & Plan:  It is not clear what the etiology of this may be. Get labs and set up a neck CT scan soon.

## 2012-12-04 NOTE — Progress Notes (Signed)
Quick Note:  I spoke with pt ______ 

## 2012-12-12 ENCOUNTER — Other Ambulatory Visit: Payer: Medicare Other

## 2012-12-13 ENCOUNTER — Ambulatory Visit (INDEPENDENT_AMBULATORY_CARE_PROVIDER_SITE_OTHER)
Admission: RE | Admit: 2012-12-13 | Discharge: 2012-12-13 | Disposition: A | Discharge status: Home / Self Care | Payer: Medicare Other | Source: Ambulatory Visit | Attending: Family Medicine | Admitting: Family Medicine

## 2012-12-13 DIAGNOSIS — R221 Localized swelling, mass and lump, neck: Secondary | ICD-10-CM

## 2012-12-13 DIAGNOSIS — R220 Localized swelling, mass and lump, head: Secondary | ICD-10-CM

## 2012-12-13 MED ORDER — IOHEXOL 300 MG/ML  SOLN
75.0000 mL | Freq: Once | INTRAMUSCULAR | Status: AC | PRN
  Administered 2012-12-13: 75 mL via INTRAVENOUS

## 2012-12-17 ENCOUNTER — Telehealth: Payer: Self-pay | Admitting: Family Medicine

## 2012-12-17 NOTE — Telephone Encounter (Signed)
Pt left voice message, wanted test results.

## 2012-12-17 NOTE — Progress Notes (Signed)
Quick Note:  I spoke with pt ______ 

## 2012-12-17 NOTE — Telephone Encounter (Signed)
The scan was normal. See the report

## 2012-12-17 NOTE — Telephone Encounter (Signed)
I spoke with pt and gave results.  

## 2013-02-22 ENCOUNTER — Other Ambulatory Visit: Payer: Self-pay | Admitting: Family Medicine

## 2013-05-29 ENCOUNTER — Encounter: Payer: Self-pay | Admitting: Family Medicine

## 2013-05-29 ENCOUNTER — Ambulatory Visit (INDEPENDENT_AMBULATORY_CARE_PROVIDER_SITE_OTHER): Payer: Medicare Other | Admitting: Family Medicine

## 2013-05-29 VITALS — BP 110/68 | HR 86 | Temp 98.7°F | Wt 115.0 lb

## 2013-05-29 DIAGNOSIS — M545 Low back pain, unspecified: Secondary | ICD-10-CM

## 2013-05-29 MED ORDER — CYCLOBENZAPRINE HCL 10 MG PO TABS: 10.0000 mg | ORAL_TABLET | Freq: Three times a day (TID) | ORAL | Status: DC | PRN

## 2013-05-29 NOTE — Progress Notes (Signed)
  Subjective:    Patient ID: Brooke Combs, female    DOB: 12-26-1945, 67 y.o.   MRN: [id number]  HPI Here for 3 days of low back pain and stiffness. 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 She woke up the next morning with this pain. It is a little better today, and Tylenol and Motrin help. No pain in the legs. Moist heat helps.    Review of Systems  Constitutional: Negative.   Genitourinary: Negative.   Musculoskeletal: Positive for back pain.       Objective:   Physical Exam  Constitutional: She appears well-developed and well-nourished. No distress.  Musculoskeletal:  Tender in the lower back with a lot of spasm along the right paraspinal muscles. ROM is full           Assessment & Plan:  Rest, stretches prn.

## 2013-05-29 NOTE — Progress Notes (Signed)
Pre visit review using our clinic review tool, if applicable. No additional management support is needed unless otherwise documented below in the visit note. 

## 2013-06-17 ENCOUNTER — Telehealth: Payer: Self-pay | Admitting: Family Medicine

## 2013-06-17 NOTE — Telephone Encounter (Signed)
Refill request for Valium and send to [facility].

## 2013-06-17 NOTE — Telephone Encounter (Signed)
Call in #60 with 5 rf 

## 2013-06-18 MED ORDER — DIAZEPAM 5 MG PO TABS: 5.0000 mg | ORAL_TABLET | Freq: Three times a day (TID) | ORAL | Status: DC | PRN

## 2013-06-18 NOTE — Telephone Encounter (Signed)
I called in script 

## 2013-08-15 ENCOUNTER — Telehealth: Payer: Self-pay | Admitting: Family Medicine

## 2013-08-15 NOTE — Telephone Encounter (Signed)
Pt needs refills on Zocor & Simvastatin, a 30 day supply.

## 2013-08-16 MED ORDER — SYNTHROID 100 MCG PO TABS: ORAL_TABLET | ORAL | Status: DC

## 2013-08-16 MED ORDER — SIMVASTATIN 40 MG PO TABS
ORAL_TABLET | ORAL | Status: DC
Start: 2013-08-16 — End: 2013-09-23

## 2013-08-16 NOTE — Telephone Encounter (Signed)
Pt needed Synthroid also and I did send both scripts and spoke with pt.

## 2013-09-19 ENCOUNTER — Ambulatory Visit: Payer: Medicare Other | Admitting: Family Medicine

## 2013-09-20 ENCOUNTER — Encounter: Payer: Self-pay | Admitting: *Deleted

## 2013-09-23 ENCOUNTER — Encounter: Payer: Self-pay | Admitting: Family Medicine

## 2013-09-23 ENCOUNTER — Ambulatory Visit (INDEPENDENT_AMBULATORY_CARE_PROVIDER_SITE_OTHER): Payer: Medicare Other | Admitting: Family Medicine

## 2013-09-23 VITALS — BP 130/70 | HR 97 | Temp 98.1°F | Ht 64.0 in | Wt 117.0 lb

## 2013-09-23 DIAGNOSIS — E039 Hypothyroidism, unspecified: Secondary | ICD-10-CM

## 2013-09-23 DIAGNOSIS — E785 Hyperlipidemia, unspecified: Secondary | ICD-10-CM

## 2013-09-23 LAB — HEPATIC FUNCTION PANEL
ALBUMIN: 4.2 g/dL (ref 3.5–5.2)
ALT: 9 U/L (ref 0–35)
AST: 18 U/L (ref 0–37)
Alkaline Phosphatase: 53 U/L (ref 39–117)
BILIRUBIN DIRECT: 0.1 mg/dL (ref 0.0–0.3)
Total Bilirubin: 1 mg/dL (ref 0.3–1.2)
Total Protein: 7.1 g/dL (ref 6.0–8.3)

## 2013-09-23 LAB — LIPID PANEL
CHOL/HDL RATIO: 4
Cholesterol: 165 mg/dL (ref 0–200)
HDL: 46.7 mg/dL (ref >39.00)
LDL CALC: 98 mg/dL (ref 0–99)
TRIGLYCERIDES: 103 mg/dL (ref 0.0–149.0)
VLDL: 20.6 mg/dL (ref 0.0–40.0)

## 2013-09-23 LAB — TSH: TSH: 0.07 u[IU]/mL — ABNORMAL LOW (ref 0.35–5.50)

## 2013-09-23 MED ORDER — SYNTHROID 100 MCG PO TABS: ORAL_TABLET | ORAL | Status: DC

## 2013-09-23 MED ORDER — SIMVASTATIN 40 MG PO TABS: ORAL_TABLET | ORAL | Status: DC

## 2013-09-23 NOTE — Progress Notes (Signed)
   Subjective:    Patient ID: Brooke Combs, female    DOB: 29-Oct-1945, 68 y.o.   MRN: [id number]  HPI Here to recheck here thyroid and her lipids. She feels well. She has stopped smoking cigarettes and has been using an electronic nicotine device for several weeks.    Review of Systems  Constitutional: Negative.   Respiratory: Negative.   Cardiovascular: Negative.   Endocrine: Negative.        Objective:   Physical Exam  Constitutional: She appears well-developed and well-nourished.  Neck: No thyromegaly present.  Cardiovascular: Normal rate, regular rhythm, normal heart sounds and intact distal pulses.   Pulmonary/Chest: Effort normal and breath sounds normal.  Lymphadenopathy:    She has no cervical adenopathy.          Assessment & Plan:  Get labs.

## 2013-09-23 NOTE — Progress Notes (Signed)
Pre visit review using our clinic review tool, if applicable. No additional management support is needed unless otherwise documented below in the visit note. 

## 2013-09-24 ENCOUNTER — Telehealth: Payer: Self-pay | Admitting: Family Medicine

## 2013-09-24 ENCOUNTER — Other Ambulatory Visit: Payer: Self-pay | Admitting: Family Medicine

## 2013-09-24 NOTE — Telephone Encounter (Signed)
Relevant patient education mailed to patient.  

## 2013-10-08 ENCOUNTER — Encounter: Payer: Self-pay | Admitting: Family Medicine

## 2014-07-31 ENCOUNTER — Encounter: Payer: Self-pay | Admitting: Family Medicine

## 2014-07-31 ENCOUNTER — Ambulatory Visit (INDEPENDENT_AMBULATORY_CARE_PROVIDER_SITE_OTHER): Payer: Medicare Other | Admitting: Family Medicine

## 2014-07-31 VITALS — BP 121/73 | HR 82 | Temp 98.9°F | Ht 64.0 in | Wt 116.0 lb

## 2014-07-31 DIAGNOSIS — E038 Other specified hypothyroidism: Secondary | ICD-10-CM

## 2014-07-31 DIAGNOSIS — E785 Hyperlipidemia, unspecified: Secondary | ICD-10-CM

## 2014-07-31 LAB — LIPID PANEL
CHOL/HDL RATIO: 3
CHOLESTEROL: 195 mg/dL (ref 0–200)
HDL: 61.1 mg/dL (ref >39.00)
LDL CALC: 116 mg/dL — AB (ref 0–99)
NONHDL: 133.9
TRIGLYCERIDES: 89 mg/dL (ref 0.0–149.0)
VLDL: 17.8 mg/dL (ref 0.0–40.0)

## 2014-07-31 LAB — CBC WITH DIFFERENTIAL/PLATELET
BASOS PCT: 0.7 % (ref 0.0–3.0)
Basophils Absolute: 0.1 10*3/uL (ref 0.0–0.1)
Eosinophils Absolute: 0.3 10*3/uL (ref 0.0–0.7)
Eosinophils Relative: 2.9 % (ref 0.0–5.0)
HEMATOCRIT: 42.6 % (ref 36.0–46.0)
HEMOGLOBIN: 13.9 g/dL (ref 12.0–15.0)
LYMPHS ABS: 4.2 10*3/uL — AB (ref 0.7–4.0)
Lymphocytes Relative: 42.2 % (ref 12.0–46.0)
MCHC: 32.6 g/dL (ref 30.0–36.0)
MCV: 93.9 fl (ref 78.0–100.0)
Monocytes Absolute: 0.6 10*3/uL (ref 0.1–1.0)
Monocytes Relative: 6.1 % (ref 3.0–12.0)
NEUTROS ABS: 4.8 10*3/uL (ref 1.4–7.7)
Neutrophils Relative %: 48.1 % (ref 43.0–77.0)
Platelets: 305 10*3/uL (ref 150.0–400.0)
RBC: 4.53 Mil/uL (ref 3.87–5.11)
RDW: 13.2 % (ref 11.5–15.5)
WBC: 9.9 10*3/uL (ref 4.0–10.5)

## 2014-07-31 LAB — HEPATIC FUNCTION PANEL
ALK PHOS: 67 U/L (ref 39–117)
ALT: 10 U/L (ref 0–35)
AST: 16 U/L (ref 0–37)
Albumin: 4.6 g/dL (ref 3.5–5.2)
Bilirubin, Direct: 0.2 mg/dL (ref 0.0–0.3)
Total Bilirubin: 0.9 mg/dL (ref 0.2–1.2)
Total Protein: 7.7 g/dL (ref 6.0–8.3)

## 2014-07-31 LAB — BASIC METABOLIC PANEL
BUN: 16 mg/dL (ref 6–23)
CALCIUM: 9.9 mg/dL (ref 8.4–10.5)
CHLORIDE: 106 meq/L (ref 96–112)
CO2: 28 mEq/L (ref 19–32)
Creatinine, Ser: 0.77 mg/dL (ref 0.40–1.20)
GFR: 79.48 mL/min (ref >60.00)
Glucose, Bld: 91 mg/dL (ref 70–99)
Potassium: 4.2 mEq/L (ref 3.5–5.1)
SODIUM: 143 meq/L (ref 135–145)

## 2014-07-31 LAB — POCT URINALYSIS DIPSTICK
Bilirubin, UA: NEGATIVE
Clarity, UA: NEGATIVE
Glucose, UA: NEGATIVE
Ketones, UA: NEGATIVE
Leukocytes, UA: NEGATIVE
NITRITE UA: NEGATIVE
PH UA: 5
Protein, UA: NEGATIVE
RBC UA: NEGATIVE
SPEC GRAV UA: 1.015
Urobilinogen, UA: 0.2

## 2014-07-31 LAB — TSH: TSH: 0.33 u[IU]/mL — AB (ref 0.35–4.50)

## 2014-07-31 MED ORDER — SIMVASTATIN 40 MG PO TABS: ORAL_TABLET | ORAL | Status: DC

## 2014-07-31 MED ORDER — SYNTHROID 100 MCG PO TABS: ORAL_TABLET | ORAL | Status: DC

## 2014-07-31 MED ORDER — DIAZEPAM 5 MG PO TABS: 5.0000 mg | ORAL_TABLET | Freq: Three times a day (TID) | ORAL | Status: DC | PRN

## 2014-07-31 NOTE — Progress Notes (Signed)
Pre visit review using our clinic review tool, if applicable. No additional management support is needed unless otherwise documented below in the visit note. 

## 2014-07-31 NOTE — Progress Notes (Signed)
   Subjective:    Patient ID: Brooke Combs, female    DOB: June 29, 1946, 69 y.o.   MRN: [id number]  HPI Here to follow up. She feels fine but admits to stress lately. She spends a lot of time every day with her sister who is struggling with some serious health issues.    Review of Systems  Constitutional: Negative.   Respiratory: Negative.   Cardiovascular: Negative.   Endocrine: Negative.        Objective:   Physical Exam  Constitutional: She appears well-developed and well-nourished.  Neck: No thyromegaly present.  Cardiovascular: Normal rate, regular rhythm, normal heart sounds and intact distal pulses.   Pulmonary/Chest: Effort normal and breath sounds normal.  Lymphadenopathy:    She has no cervical adenopathy.          Assessment & Plan:  Get labs today. Refilled Valium

## 2014-08-06 ENCOUNTER — Telehealth: Payer: Self-pay | Admitting: Family Medicine

## 2014-08-06 NOTE — Telephone Encounter (Signed)
Pt left a voice message and stated that the Synthroid cost too much and wants to know what else we can recommend?

## 2014-08-07 MED ORDER — LEVOTHYROXINE SODIUM 100 MCG PO TABS: 100.0000 ug | ORAL_TABLET | Freq: Every day | ORAL | Status: DC

## 2014-08-07 NOTE — Telephone Encounter (Signed)
Per Dr. Clent RidgesFry send in a script for generic and dose will stay the same. I spoke with pt and sent new script e-scribe.

## 2014-12-09 ENCOUNTER — Telehealth: Payer: Self-pay

## 2014-12-09 NOTE — Telephone Encounter (Signed)
Left message for pt to call back concerning need for mammogram 

## 2015-03-06 ENCOUNTER — Ambulatory Visit: Payer: Medicare Other | Admitting: Family Medicine

## 2015-03-25 ENCOUNTER — Other Ambulatory Visit: Payer: Self-pay | Admitting: *Deleted

## 2015-03-25 MED ORDER — LEVOTHYROXINE SODIUM 100 MCG PO TABS: 100.0000 ug | ORAL_TABLET | Freq: Every day | ORAL | Status: DC

## 2015-03-27 ENCOUNTER — Encounter: Payer: Self-pay | Admitting: Family Medicine

## 2015-03-27 ENCOUNTER — Ambulatory Visit (INDEPENDENT_AMBULATORY_CARE_PROVIDER_SITE_OTHER): Payer: Medicare Other | Admitting: Family Medicine

## 2015-03-27 VITALS — BP 129/79 | HR 89 | Temp 98.7°F | Ht 64.0 in | Wt 111.0 lb

## 2015-03-27 DIAGNOSIS — J209 Acute bronchitis, unspecified: Secondary | ICD-10-CM

## 2015-03-27 MED ORDER — DIAZEPAM 5 MG PO TABS: 5.0000 mg | ORAL_TABLET | Freq: Three times a day (TID) | ORAL | Status: DC | PRN

## 2015-03-27 MED ORDER — AZITHROMYCIN 250 MG PO TABS
ORAL_TABLET | ORAL | Status: DC
Start: 2015-03-27 — End: 2015-09-17

## 2015-03-27 NOTE — Progress Notes (Signed)
Pre visit review using our clinic review tool, if applicable. No additional management support is needed unless otherwise documented below in the visit note. 

## 2015-03-27 NOTE — Progress Notes (Signed)
   Subjective:    Patient ID: Brooke Combs, female    DOB: [birth year], 69 y.o.   MRN: [id number]  HPI Here for 4 days of chest tightness and coughing up green sputum. Using Mucinex. No fever.    Review of Systems  Constitutional: Negative.   HENT: Positive for congestion, postnasal drip and sinus pressure.   Eyes: Negative.   Respiratory: Positive for cough and chest tightness. Negative for shortness of breath and wheezing.   Cardiovascular: Negative.        Objective:   Physical Exam  Constitutional: She appears well-developed and well-nourished.  HENT:  Right Ear: External ear normal.  Left Ear: External ear normal.  Nose: Nose normal.  Mouth/Throat: Oropharynx is clear and moist.  Eyes: Conjunctivae are normal.  Cardiovascular: Normal rate, regular rhythm, normal heart sounds and intact distal pulses.   Pulmonary/Chest: Effort normal. No respiratory distress. She has no wheezes. She has no rales.  Scattered rhonchi  Lymphadenopathy:    She has no cervical adenopathy.          Assessment & Plan:  Bronchitis, treat with a Zpack

## 2015-04-01 ENCOUNTER — Telehealth: Payer: Self-pay | Admitting: Family Medicine

## 2015-04-01 MED ORDER — BENZONATATE 200 MG PO CAPS: 200.0000 mg | ORAL_CAPSULE | Freq: Two times a day (BID) | ORAL | Status: DC | PRN

## 2015-04-01 MED ORDER — AMOXICILLIN-POT CLAVULANATE 875-125 MG PO TABS: 1.0000 | ORAL_TABLET | Freq: Two times a day (BID) | ORAL | Status: DC

## 2015-04-01 NOTE — Telephone Encounter (Signed)
Call in Augmentin 875 bid for 10 days, also Benzonatate 200 mg to take bid prn cough, #60 with no rf

## 2015-04-01 NOTE — Telephone Encounter (Signed)
Pt left a voice message, requesting something to be called in for cough and a refill on antibiotic. ( lives too far to pick up script for cough syrup )  She is still coughing and has congestion.

## 2015-04-01 NOTE — Telephone Encounter (Signed)
I sent both scripts e-scribe and spoke with pt. 

## 2015-09-17 ENCOUNTER — Encounter: Payer: Self-pay | Admitting: Family Medicine

## 2015-09-17 ENCOUNTER — Ambulatory Visit (INDEPENDENT_AMBULATORY_CARE_PROVIDER_SITE_OTHER): Payer: Medicare Other | Admitting: Family Medicine

## 2015-09-17 VITALS — BP 114/71 | HR 78 | Temp 98.4°F | Ht 64.0 in | Wt 116.0 lb

## 2015-09-17 DIAGNOSIS — E785 Hyperlipidemia, unspecified: Secondary | ICD-10-CM | POA: Diagnosis not present

## 2015-09-17 DIAGNOSIS — E038 Other specified hypothyroidism: Secondary | ICD-10-CM | POA: Diagnosis not present

## 2015-09-17 DIAGNOSIS — K219 Gastro-esophageal reflux disease without esophagitis: Secondary | ICD-10-CM | POA: Diagnosis not present

## 2015-09-17 DIAGNOSIS — F17200 Nicotine dependence, unspecified, uncomplicated: Secondary | ICD-10-CM

## 2015-09-17 DIAGNOSIS — F411 Generalized anxiety disorder: Secondary | ICD-10-CM | POA: Insufficient documentation

## 2015-09-17 LAB — CBC WITH DIFFERENTIAL/PLATELET
BASOS ABS: 0.1 10*3/uL (ref 0.0–0.1)
Basophils Relative: 0.4 % (ref 0.0–3.0)
EOS ABS: 0.2 10*3/uL (ref 0.0–0.7)
Eosinophils Relative: 1.5 % (ref 0.0–5.0)
HEMATOCRIT: 40.3 % (ref 36.0–46.0)
HEMOGLOBIN: 13.6 g/dL (ref 12.0–15.0)
LYMPHS PCT: 26.8 % (ref 12.0–46.0)
Lymphs Abs: 3.1 10*3/uL (ref 0.7–4.0)
MCHC: 33.8 g/dL (ref 30.0–36.0)
MCV: 91.1 fl (ref 78.0–100.0)
MONO ABS: 0.6 10*3/uL (ref 0.1–1.0)
Monocytes Relative: 5.1 % (ref 3.0–12.0)
Neutro Abs: 7.6 10*3/uL (ref 1.4–7.7)
Neutrophils Relative %: 66.2 % (ref 43.0–77.0)
Platelets: 266 10*3/uL (ref 150.0–400.0)
RBC: 4.42 Mil/uL (ref 3.87–5.11)
RDW: 12.9 % (ref 11.5–15.5)
WBC: 11.5 10*3/uL — AB (ref 4.0–10.5)

## 2015-09-17 LAB — HEPATIC FUNCTION PANEL
ALT: 10 U/L (ref 0–35)
AST: 18 U/L (ref 0–37)
Albumin: 4.1 g/dL (ref 3.5–5.2)
Alkaline Phosphatase: 58 U/L (ref 39–117)
Bilirubin, Direct: 0.1 mg/dL (ref 0.0–0.3)
TOTAL PROTEIN: 6.8 g/dL (ref 6.0–8.3)
Total Bilirubin: 0.9 mg/dL (ref 0.2–1.2)

## 2015-09-17 LAB — BASIC METABOLIC PANEL
BUN: 13 mg/dL (ref 6–23)
CALCIUM: 9.4 mg/dL (ref 8.4–10.5)
CHLORIDE: 106 meq/L (ref 96–112)
CO2: 28 mEq/L (ref 19–32)
CREATININE: 0.77 mg/dL (ref 0.40–1.20)
GFR: 79.21 mL/min (ref >60.00)
Glucose, Bld: 96 mg/dL (ref 70–99)
Potassium: 4.6 mEq/L (ref 3.5–5.1)
Sodium: 140 mEq/L (ref 135–145)

## 2015-09-17 LAB — LIPID PANEL
Cholesterol: 180 mg/dL (ref 0–200)
HDL: 48.8 mg/dL (ref >39.00)
LDL CALC: 109 mg/dL — AB (ref 0–99)
NONHDL: 131.03
TRIGLYCERIDES: 109 mg/dL (ref 0.0–149.0)
Total CHOL/HDL Ratio: 4
VLDL: 21.8 mg/dL (ref 0.0–40.0)

## 2015-09-17 LAB — TSH: TSH: 0.09 u[IU]/mL — AB (ref 0.35–4.50)

## 2015-09-17 MED ORDER — SIMVASTATIN 40 MG PO TABS: ORAL_TABLET | ORAL | Status: DC

## 2015-09-17 MED ORDER — LEVOTHYROXINE SODIUM 100 MCG PO TABS: 100.0000 ug | ORAL_TABLET | Freq: Every day | ORAL | Status: DC

## 2015-09-17 MED ORDER — DIAZEPAM 5 MG PO TABS: 5.0000 mg | ORAL_TABLET | Freq: Three times a day (TID) | ORAL | Status: DC | PRN

## 2015-09-17 NOTE — Progress Notes (Signed)
   Subjective:    Patient ID: Brooke Combs, female    DOB: [birth year], 70 y.o.   MRN: [id number]  HPI Here to follow up. She feels great and has no concerns. She watches her diet closely. She is interested in quitting smoking and she asks about nicotine patches. She says she does not want to try Chantix or any other oral medication for this.    Review of Systems  Constitutional: Negative.   Respiratory: Negative.   Cardiovascular: Negative.   Endocrine: Negative.   Neurological: Negative.   Psychiatric/Behavioral: Negative.        Objective:   Physical Exam  Constitutional: She is oriented to person, place, and time. She appears well-developed and well-nourished.  Neck: No thyromegaly present.  Cardiovascular: Normal rate, regular rhythm, normal heart sounds and intact distal pulses.   Pulmonary/Chest: Effort normal and breath sounds normal.  Lymphadenopathy:    She has no cervical adenopathy.  Neurological: She is alert and oriented to person, place, and time.          Assessment & Plan:  Check a TSH for her thyroid status. Check a lipid panel. I encouraged her to try OTC nicotine patches.

## 2015-09-17 NOTE — Progress Notes (Signed)
Pre visit review using our clinic review tool, if applicable. No additional management support is needed unless otherwise documented below in the visit note. 

## 2015-10-12 ENCOUNTER — Telehealth: Payer: Self-pay | Admitting: Family Medicine

## 2015-10-12 NOTE — Telephone Encounter (Signed)
Pt left a voice message, she is requesting brand name only Synthroid. She was seen recently and given script for generic, she has always taken brand name. Please resend script and call pt.

## 2015-10-13 MED ORDER — SYNTHROID 100 MCG PO TABS: 100.0000 ug | ORAL_TABLET | Freq: Every day | ORAL | Status: DC

## 2015-10-13 NOTE — Telephone Encounter (Signed)
Per Dr. Clent RidgesFry okay to resend script for brand name only. I resent script e-scribe and spoke with pt.

## 2015-11-05 ENCOUNTER — Telehealth: Payer: Self-pay | Admitting: Family Medicine

## 2015-11-05 NOTE — Telephone Encounter (Signed)
Okay to schedule

## 2015-11-05 NOTE — Telephone Encounter (Signed)
Pt would like an appointment tomorrow for vertigo. Pt decline to see another provider. Can I use a sda slot?

## 2015-11-05 NOTE — Telephone Encounter (Signed)
Pt scheduled  

## 2015-11-06 ENCOUNTER — Ambulatory Visit (INDEPENDENT_AMBULATORY_CARE_PROVIDER_SITE_OTHER): Payer: Medicare Other | Admitting: Family Medicine

## 2015-11-06 ENCOUNTER — Encounter: Payer: Self-pay | Admitting: Family Medicine

## 2015-11-06 VITALS — BP 135/67 | HR 87 | Temp 98.6°F | Ht 64.0 in | Wt 113.0 lb

## 2015-11-06 DIAGNOSIS — R531 Weakness: Secondary | ICD-10-CM

## 2015-11-06 DIAGNOSIS — E038 Other specified hypothyroidism: Secondary | ICD-10-CM

## 2015-11-06 LAB — T4, FREE: FREE T4: 1.88 ng/dL — AB (ref 0.60–1.60)

## 2015-11-06 LAB — T3, FREE: T3, Free: 3.7 pg/mL (ref 2.3–4.2)

## 2015-11-06 LAB — TSH: TSH: 0.41 u[IU]/mL (ref 0.35–4.50)

## 2015-11-06 MED ORDER — SYNTHROID 100 MCG PO TABS: 100.0000 ug | ORAL_TABLET | Freq: Every day | ORAL | Status: DC

## 2015-11-06 NOTE — Progress Notes (Signed)
Pre visit review using our clinic review tool, if applicable. No additional management support is needed unless otherwise documented below in the visit note. 

## 2015-11-06 NOTE — Progress Notes (Signed)
   Subjective:    Patient ID: Brooke Combs, female    DOB: 1946/04/18, 70 y.o.   MRN: [id number]  HPI Here for 2 days of generalized weakness and fatigue. No palpitations or chest pain or SOB. No change in GI or bladder functions. She had normal complete labs done 6 weeks ago.    Review of Systems  Constitutional: Positive for fatigue.  Eyes: Negative.   Respiratory: Negative.   Cardiovascular: Negative.   Gastrointestinal: Negative.   Genitourinary: Negative.   Neurological: Positive for weakness. Negative for dizziness, tremors, seizures, syncope, facial asymmetry, speech difficulty, light-headedness, numbness and headaches.       Objective:   Physical Exam  Constitutional: She is oriented to person, place, and time. She appears well-developed and well-nourished. No distress.  Neck: No thyromegaly present.  Cardiovascular: Normal rate, normal heart sounds and intact distal pulses.   No murmur heard. Irregular rhythm. EKG is normal with NSR.  Pulmonary/Chest: Effort normal and breath sounds normal.  Abdominal: Soft. Bowel sounds are normal. She exhibits no distension. There is no tenderness. There is no rebound and no guarding.  Musculoskeletal: She exhibits no edema.  Lymphadenopathy:    She has no cervical adenopathy.  Neurological: She is alert and oriented to person, place, and time.          Assessment & Plan:  Weakness of uncertain etiology. We will get a full thyroid panel to see what the actual levels look like.  Nelwyn SalisburyFRY,Francetta Ilg A, MD

## 2015-11-12 MED ORDER — SYNTHROID 88 MCG PO TABS: 88.0000 ug | ORAL_TABLET | Freq: Every day | ORAL | Status: DC

## 2015-11-12 NOTE — Addendum Note (Signed)
Addended by: Aniceto BossNIMMONS, Chanel Mckesson A on: 11/12/2015 12:40 PM   Modules accepted: Orders, Medications

## 2016-03-28 ENCOUNTER — Other Ambulatory Visit: Payer: Self-pay | Admitting: Family Medicine

## 2016-03-28 NOTE — Telephone Encounter (Signed)
Call in #90 with 5 rf 

## 2016-04-20 ENCOUNTER — Ambulatory Visit: Payer: Medicare Other | Admitting: Family Medicine

## 2016-06-01 ENCOUNTER — Encounter: Payer: Self-pay | Admitting: Family Medicine

## 2016-06-01 ENCOUNTER — Ambulatory Visit (INDEPENDENT_AMBULATORY_CARE_PROVIDER_SITE_OTHER): Payer: Medicare Other | Admitting: Family Medicine

## 2016-06-01 ENCOUNTER — Ambulatory Visit (INDEPENDENT_AMBULATORY_CARE_PROVIDER_SITE_OTHER)
Admission: RE | Admit: 2016-06-01 | Discharge: 2016-06-01 | Disposition: A | Discharge status: Home / Self Care | Payer: Medicare Other | Source: Ambulatory Visit | Attending: Family Medicine | Admitting: Family Medicine

## 2016-06-01 VITALS — BP 128/58 | Temp 98.3°F | Ht 64.0 in | Wt 109.0 lb

## 2016-06-01 DIAGNOSIS — R05 Cough: Secondary | ICD-10-CM

## 2016-06-01 DIAGNOSIS — R059 Cough, unspecified: Secondary | ICD-10-CM

## 2016-06-01 MED ORDER — LEVOTHYROXINE SODIUM 100 MCG PO TABS: 100.0000 ug | ORAL_TABLET | Freq: Every day | ORAL | 3 refills | Status: DC

## 2016-06-01 NOTE — Progress Notes (Signed)
Pre visit review using our clinic review tool, if applicable. No additional management support is needed unless otherwise documented below in the visit note. 

## 2016-06-01 NOTE — Progress Notes (Signed)
   Subjective:    Patient ID: Brooke Combs, female    DOB: 15-Dec-1945, 70 y.o.   MRN: [id number]  HPI Here with concerns about her long hx of smoking. She asks if she can have a CXR, her last one being at least 7 years ago. She has a mild nonproductive cough that comes and goes. No fever or SOB.    Review of Systems  Constitutional: Negative.   Eyes: Negative.   Respiratory: Positive for cough. Negative for chest tightness, shortness of breath and wheezing.   Cardiovascular: Negative.        Objective:   Physical Exam  Constitutional: She is oriented to person, place, and time. She appears well-developed and well-nourished.  Neck: No thyromegaly present.  Cardiovascular: Normal rate, regular rhythm, normal heart sounds and intact distal pulses.   Pulmonary/Chest: Effort normal and breath sounds normal. No respiratory distress. She has no wheezes. She has no rales.  Lymphadenopathy:    She has no cervical adenopathy.  Neurological: She is alert and oriented to person, place, and time.          Assessment & Plan:  Cough in a smoker, probably the start of some chronic bronchitis. I again urged her to quit smoking. Set up a CXR soon. Nelwyn SalisburyFRY,Tykwon Fera A, MD

## 2016-06-15 ENCOUNTER — Ambulatory Visit (INDEPENDENT_AMBULATORY_CARE_PROVIDER_SITE_OTHER): Payer: Medicare Other | Admitting: Family Medicine

## 2016-06-15 ENCOUNTER — Encounter: Payer: Self-pay | Admitting: Family Medicine

## 2016-06-15 ENCOUNTER — Telehealth: Payer: Self-pay | Admitting: Family Medicine

## 2016-06-15 VITALS — BP 128/78 | HR 75 | Temp 98.0°F | Wt 110.8 lb

## 2016-06-15 DIAGNOSIS — J439 Emphysema, unspecified: Secondary | ICD-10-CM | POA: Diagnosis not present

## 2016-06-15 MED ORDER — BECLOMETHASONE DIPROPIONATE 40 MCG/ACT IN AERS: 2.0000 | INHALATION_SPRAY | Freq: Two times a day (BID) | RESPIRATORY_TRACT | 12 refills | Status: DC

## 2016-06-15 MED ORDER — IPRATROPIUM-ALBUTEROL 18-103 MCG/ACT IN AERO: 2.0000 | INHALATION_SPRAY | Freq: Four times a day (QID) | RESPIRATORY_TRACT | 11 refills | Status: DC | PRN

## 2016-06-15 NOTE — Progress Notes (Signed)
   Subjective:    Patient ID: Brooke Combs, female    DOB: [birth year], 70 y.o.   MRN: [id number]  HPI Here to discuss the results of a recent CXR showing severe emphysema. She has an occasional cough but not much. She does get mildly SOB on exertion. She has been smoking for may years and never seriously tried to quit, but now she is motivated to do so. She does not want to try Chantix because a friend of hers tried it and had side effects. She has never used nicotine gum or patches.    Review of Systems  Constitutional: Negative.   Respiratory: Positive for cough and shortness of breath. Negative for chest tightness, wheezing and stridor.   Cardiovascular: Negative.   Neurological: Negative.        Objective:   Physical Exam  Constitutional: She is oriented to person, place, and time. She appears well-developed and well-nourished.  Neck: No thyromegaly present.  Cardiovascular: Normal rate, regular rhythm, normal heart sounds and intact distal pulses.   Pulmonary/Chest: Effort normal and breath sounds normal. No respiratory distress. She has no wheezes. She has no rales.  Lymphadenopathy:    She has no cervical adenopathy.  Neurological: She is alert and oriented to person, place, and time.          Assessment & Plan:  COPD with emphysema. I again stressed the need for her to quit smoking and she agrees. She will try nicotine patches. Start on Combivent every 6 hours prn.  Nelwyn SalisburyFRY,STEPHEN A, MD

## 2016-06-15 NOTE — Progress Notes (Signed)
Pre visit review using our clinic review tool, if applicable. No additional management support is needed unless otherwise documented below in the visit note. 

## 2016-06-15 NOTE — Telephone Encounter (Signed)
See below

## 2016-06-15 NOTE — Telephone Encounter (Signed)
Pr states neither one of the inhalers were covered.  I called the pharmacy to see if they would send a prior auth.  Pharmacy said it is not a prior auth, it is just that these   are not on her formulary. advised pt she would need to contact her insurance company to see what is on her folumalary.  Unless dr Clent RidgesFry wants to send in another Rx to see if covered.

## 2016-06-15 NOTE — Telephone Encounter (Signed)
Let the patient find out what inhalers are covered and then  she can let us know

## 2016-06-24 MED ORDER — ALBUTEROL SULFATE HFA 108 (90 BASE) MCG/ACT IN AERS: 2.0000 | INHALATION_SPRAY | RESPIRATORY_TRACT | 11 refills | Status: DC | PRN

## 2016-06-24 MED ORDER — FLUTICASONE PROPIONATE HFA 44 MCG/ACT IN AERO: 2.0000 | INHALATION_SPRAY | Freq: Two times a day (BID) | RESPIRATORY_TRACT | 11 refills | Status: DC

## 2016-06-24 NOTE — Telephone Encounter (Signed)
Pt states her insurance will cover flovent hfa 44 mcg

## 2016-06-24 NOTE — Telephone Encounter (Signed)
I left a voice message for pt to return my call. I need to know which inhaler is covered and then we can send in script?

## 2016-06-24 NOTE — Telephone Encounter (Signed)
Pt states she has called her insurance company and they only gave her one: flovent-hfa 44 mcg act inhaler 10.6 gm  [facility], [place]

## 2016-06-24 NOTE — Telephone Encounter (Signed)
[facility], updated mediation list and spoke with pt.

## 2016-06-24 NOTE — Telephone Encounter (Signed)
Cancel the Combivent and Qvar. Instead call in Flovent HFA 44 mcg to use 2 puffs bid, #1 with 11 rf. Also any albuterol HFA to use 2 puffs q 4 hours prn SOB, #1 with 11 rf

## 2016-09-13 ENCOUNTER — Ambulatory Visit (INDEPENDENT_AMBULATORY_CARE_PROVIDER_SITE_OTHER): Payer: Medicare HMO | Admitting: Family Medicine

## 2016-09-13 ENCOUNTER — Other Ambulatory Visit: Payer: Self-pay | Admitting: Family Medicine

## 2016-09-13 ENCOUNTER — Encounter: Payer: Self-pay | Admitting: Family Medicine

## 2016-09-13 VITALS — BP 136/72 | HR 98 | Ht 64.0 in | Wt 114.0 lb

## 2016-09-13 DIAGNOSIS — E782 Mixed hyperlipidemia: Secondary | ICD-10-CM | POA: Diagnosis not present

## 2016-09-13 DIAGNOSIS — E039 Hypothyroidism, unspecified: Secondary | ICD-10-CM | POA: Diagnosis not present

## 2016-09-13 DIAGNOSIS — J438 Other emphysema: Secondary | ICD-10-CM | POA: Diagnosis not present

## 2016-09-13 LAB — HEPATIC FUNCTION PANEL
ALT: 11 U/L (ref 0–35)
AST: 15 U/L (ref 0–37)
Albumin: 4.3 g/dL (ref 3.5–5.2)
Alkaline Phosphatase: 61 U/L (ref 39–117)
BILIRUBIN DIRECT: 0.2 mg/dL (ref 0.0–0.3)
BILIRUBIN TOTAL: 1.1 mg/dL (ref 0.2–1.2)
TOTAL PROTEIN: 6.8 g/dL (ref 6.0–8.3)

## 2016-09-13 LAB — BASIC METABOLIC PANEL
BUN: 15 mg/dL (ref 6–23)
CALCIUM: 9.8 mg/dL (ref 8.4–10.5)
CO2: 28 mEq/L (ref 19–32)
CREATININE: 0.8 mg/dL (ref 0.40–1.20)
Chloride: 107 mEq/L (ref 96–112)
GFR: 75.57 mL/min (ref >60.00)
Glucose, Bld: 92 mg/dL (ref 70–99)
Potassium: 4.7 mEq/L (ref 3.5–5.1)
Sodium: 143 mEq/L (ref 135–145)

## 2016-09-13 LAB — CBC WITH DIFFERENTIAL/PLATELET
BASOS ABS: 0.1 10*3/uL (ref 0.0–0.1)
Basophils Relative: 0.8 % (ref 0.0–3.0)
EOS ABS: 0.2 10*3/uL (ref 0.0–0.7)
Eosinophils Relative: 2.4 % (ref 0.0–5.0)
HEMATOCRIT: 41.1 % (ref 36.0–46.0)
Hemoglobin: 13.8 g/dL (ref 12.0–15.0)
LYMPHS PCT: 33.4 % (ref 12.0–46.0)
Lymphs Abs: 3 10*3/uL (ref 0.7–4.0)
MCHC: 33.7 g/dL (ref 30.0–36.0)
MCV: 92.4 fl (ref 78.0–100.0)
Monocytes Absolute: 0.5 10*3/uL (ref 0.1–1.0)
Monocytes Relative: 5.7 % (ref 3.0–12.0)
NEUTROS PCT: 57.7 % (ref 43.0–77.0)
Neutro Abs: 5.2 10*3/uL (ref 1.4–7.7)
PLATELETS: 292 10*3/uL (ref 150.0–400.0)
RBC: 4.45 Mil/uL (ref 3.87–5.11)
RDW: 12.3 % (ref 11.5–15.5)
WBC: 9 10*3/uL (ref 4.0–10.5)

## 2016-09-13 LAB — POC URINALSYSI DIPSTICK (AUTOMATED)
Bilirubin, UA: NEGATIVE
Glucose, UA: NEGATIVE
Nitrite, UA: NEGATIVE
PH UA: 5
PROTEIN UA: NEGATIVE
RBC UA: NEGATIVE
Urobilinogen, UA: 0.2

## 2016-09-13 LAB — LIPID PANEL
Cholesterol: 181 mg/dL (ref 0–200)
HDL: 55.5 mg/dL
LDL Cholesterol: 105 mg/dL — ABNORMAL HIGH (ref 0–99)
NonHDL: 125.31
Total CHOL/HDL Ratio: 3
Triglycerides: 100 mg/dL (ref 0.0–149.0)
VLDL: 20 mg/dL (ref 0.0–40.0)

## 2016-09-13 LAB — TSH

## 2016-09-13 NOTE — Progress Notes (Signed)
Pre visit review using our clinic review tool, if applicable. No additional management support is needed unless otherwise documented below in the visit note. 

## 2016-09-13 NOTE — Progress Notes (Signed)
   Subjective:    Patient ID: Melvern SampleJessie A Dunkerson, female    DOB: 05/15/1946, 71 y.o.   MRN: [id number]  HPI Here to follow up on COPD, lipids, and her thyroid. She feels great. The Flovent and Ventolin inhalers have made a big difference and she is less SOB. She is using nicotine lozenges OTC and has almost totally quit smoking.    Review of Systems  Constitutional: Negative.   Respiratory: Positive for cough and shortness of breath. Negative for wheezing.   Cardiovascular: Negative.        Objective:   Physical Exam  Constitutional: She is oriented to person, place, and time. She appears well-developed and well-nourished.  Cardiovascular: Normal rate, regular rhythm, normal heart sounds and intact distal pulses.   Pulmonary/Chest: Effort normal and breath sounds normal. No respiratory distress. She has no wheezes. She has no rales.  Neurological: She is alert and oriented to person, place, and time.          Assessment & Plan:  Her COPD is stable. We will get fasting labs today to check her TSH and lipids.  Gershon CraneStephen Lizza Huffaker, MD

## 2016-10-14 ENCOUNTER — Other Ambulatory Visit: Payer: Self-pay | Admitting: Family Medicine

## 2016-11-04 ENCOUNTER — Ambulatory Visit (INDEPENDENT_AMBULATORY_CARE_PROVIDER_SITE_OTHER): Payer: Medicare HMO | Admitting: Family Medicine

## 2016-11-04 ENCOUNTER — Encounter: Payer: Self-pay | Admitting: Family Medicine

## 2016-11-04 VITALS — BP 138/74 | HR 80 | Temp 98.2°F | Wt 116.0 lb

## 2016-11-04 DIAGNOSIS — E039 Hypothyroidism, unspecified: Secondary | ICD-10-CM

## 2016-11-04 DIAGNOSIS — R42 Dizziness and giddiness: Secondary | ICD-10-CM

## 2016-11-04 LAB — CBC WITH DIFFERENTIAL/PLATELET
BASOS PCT: 0.8 % (ref 0.0–3.0)
Basophils Absolute: 0.1 10*3/uL (ref 0.0–0.1)
EOS PCT: 1.3 % (ref 0.0–5.0)
Eosinophils Absolute: 0.1 10*3/uL (ref 0.0–0.7)
HCT: 42.7 % (ref 36.0–46.0)
Hemoglobin: 14.1 g/dL (ref 12.0–15.0)
LYMPHS ABS: 3.3 10*3/uL (ref 0.7–4.0)
Lymphocytes Relative: 34.1 % (ref 12.0–46.0)
MCHC: 33 g/dL (ref 30.0–36.0)
MCV: 91.8 fl (ref 78.0–100.0)
MONO ABS: 0.6 10*3/uL (ref 0.1–1.0)
Monocytes Relative: 6 % (ref 3.0–12.0)
NEUTROS ABS: 5.6 10*3/uL (ref 1.4–7.7)
NEUTROS PCT: 57.8 % (ref 43.0–77.0)
Platelets: 284 10*3/uL (ref 150.0–400.0)
RBC: 4.66 Mil/uL (ref 3.87–5.11)
RDW: 12.5 % (ref 11.5–15.5)
WBC: 9.6 10*3/uL (ref 4.0–10.5)

## 2016-11-04 LAB — VITAMIN D 25 HYDROXY (VIT D DEFICIENCY, FRACTURES): VITD: 22.61 ng/mL — AB (ref 30.00–100.00)

## 2016-11-04 LAB — HEPATIC FUNCTION PANEL
ALBUMIN: 4.3 g/dL (ref 3.5–5.2)
ALT: 10 U/L (ref 0–35)
AST: 14 U/L (ref 0–37)
Alkaline Phosphatase: 58 U/L (ref 39–117)
Bilirubin, Direct: 0.2 mg/dL (ref 0.0–0.3)
TOTAL PROTEIN: 6.7 g/dL (ref 6.0–8.3)
Total Bilirubin: 0.9 mg/dL (ref 0.2–1.2)

## 2016-11-04 LAB — POC URINALSYSI DIPSTICK (AUTOMATED)
Bilirubin, UA: NEGATIVE
Blood, UA: NEGATIVE
Glucose, UA: NEGATIVE
KETONES UA: NEGATIVE
Leukocytes, UA: NEGATIVE
Nitrite, UA: NEGATIVE
PH UA: 6 (ref 5.0–8.0)
Protein, UA: NEGATIVE
Urobilinogen, UA: 0.2 E.U./dL

## 2016-11-04 LAB — T3, FREE: T3, Free: 3.8 pg/mL (ref 2.3–4.2)

## 2016-11-04 LAB — BASIC METABOLIC PANEL
BUN: 15 mg/dL (ref 6–23)
CHLORIDE: 106 meq/L (ref 96–112)
CO2: 28 meq/L (ref 19–32)
Calcium: 9.6 mg/dL (ref 8.4–10.5)
Creatinine, Ser: 0.84 mg/dL (ref 0.40–1.20)
GFR: 71.4 mL/min (ref >60.00)
Glucose, Bld: 94 mg/dL (ref 70–99)
POTASSIUM: 4.9 meq/L (ref 3.5–5.1)
SODIUM: 141 meq/L (ref 135–145)

## 2016-11-04 LAB — T4, FREE: FREE T4: 1.62 ng/dL — AB (ref 0.60–1.60)

## 2016-11-04 LAB — VITAMIN B12: Vitamin B-12: 543 pg/mL (ref 211–911)

## 2016-11-04 LAB — TSH: TSH: <0.01 u[IU]/mL — ABNORMAL LOW (ref 0.35–4.50)

## 2016-11-04 NOTE — Progress Notes (Signed)
   Subjective:    Patient ID: Brooke Combs, female    DOB: May 10, 1946, 71 y.o.   MRN: [id number]  HPI Here for one week of intermittent lightheadedness. She feels as if she may pass out but never gets to that point. She has never felt like this before. No headaches or blurred vision. No other neurologic deficits. No chest pain or SOB or palpitations. Her BP at home has ben slightly higher than usual but too high (often 140s over 90s).    Review of Systems  Constitutional: Negative.   HENT: Negative.   Eyes: Negative.   Respiratory: Negative.   Cardiovascular: Negative.   Gastrointestinal: Negative.   Genitourinary: Negative.   Neurological: Positive for light-headedness. Negative for dizziness, tremors, seizures, syncope, facial asymmetry, speech difficulty, weakness, numbness and headaches.       Objective:   Physical Exam  Constitutional: She is oriented to person, place, and time. She appears well-developed and well-nourished. No distress.  HENT:  Head: Normocephalic and atraumatic.  Right Ear: External ear normal.  Left Ear: External ear normal.  Nose: Nose normal.  Mouth/Throat: Oropharynx is clear and moist.  No carotid bruits   Eyes: Conjunctivae and EOM are normal. Pupils are equal, round, and reactive to light.  Neck: Normal range of motion. Neck supple. No thyromegaly present.  Cardiovascular: Normal rate, regular rhythm, normal heart sounds and intact distal pulses.   No murmur heard. Pulmonary/Chest: Breath sounds normal. She is in respiratory distress. She has no wheezes. She has no rales.  Abdominal: Soft. Bowel sounds are normal. She exhibits no distension and no mass. There is no rebound and no guarding.  Lymphadenopathy:    She has no cervical adenopathy.  Neurological: She is alert and oriented to person, place, and time. No cranial nerve deficit. She exhibits normal muscle tone. Coordination normal.          Assessment & Plan:  Intermittent  lightheadedness of unclear etiology. Get labs today and set up a carotid doppler next week. Recheck prn.  Gershon Crane, MD u

## 2016-11-04 NOTE — Patient Instructions (Signed)
WE NOW OFFER   [facility] FAST TRACK!!!  SAME DAY Appointments for ACUTE CARE  Such as: Sprains, Injuries, cuts, abrasions, rashes, muscle pain, joint pain, back pain Colds, flu, sore throats, headache, allergies, cough, fever  Ear pain, sinus and eye infections Abdominal pain, nausea, vomiting, diarrhea, upset stomach Animal/insect bites  3 Easy Ways to Schedule: Walk-In Scheduling Call in scheduling Mychart Sign-up: [email or web address]         

## 2016-11-04 NOTE — Progress Notes (Signed)
Pre visit review using our clinic review tool, if applicable. No additional management support is needed unless otherwise documented below in the visit note. 

## 2016-11-08 ENCOUNTER — Other Ambulatory Visit: Payer: Self-pay | Admitting: Family Medicine

## 2016-11-08 MED ORDER — LEVOTHYROXINE SODIUM 75 MCG PO TABS: 75.0000 ug | ORAL_TABLET | Freq: Every day | ORAL | 2 refills | Status: DC

## 2016-11-09 ENCOUNTER — Ambulatory Visit (HOSPITAL_COMMUNITY)
Admission: RE | Admit: 2016-11-09 | Discharge: 2016-11-09 | Disposition: A | Discharge status: Home / Self Care | Payer: Medicare HMO | Source: Ambulatory Visit | Attending: Cardiovascular Disease | Admitting: Cardiovascular Disease

## 2016-11-09 DIAGNOSIS — R42 Dizziness and giddiness: Secondary | ICD-10-CM | POA: Diagnosis not present

## 2016-11-10 ENCOUNTER — Other Ambulatory Visit: Payer: Self-pay | Admitting: Family Medicine

## 2016-12-07 ENCOUNTER — Encounter: Payer: Self-pay | Admitting: Family Medicine

## 2016-12-07 ENCOUNTER — Ambulatory Visit (INDEPENDENT_AMBULATORY_CARE_PROVIDER_SITE_OTHER): Payer: Medicare HMO | Admitting: Family Medicine

## 2016-12-07 VITALS — BP 111/71 | HR 82 | Temp 98.3°F | Ht 64.0 in | Wt 115.0 lb

## 2016-12-07 DIAGNOSIS — E039 Hypothyroidism, unspecified: Secondary | ICD-10-CM | POA: Diagnosis not present

## 2016-12-07 DIAGNOSIS — E559 Vitamin D deficiency, unspecified: Secondary | ICD-10-CM | POA: Diagnosis not present

## 2016-12-07 MED ORDER — SYNTHROID 100 MCG PO TABS: ORAL_TABLET | ORAL | 3 refills | Status: DC

## 2016-12-07 NOTE — Patient Instructions (Signed)
WE NOW OFFER   [facility] FAST TRACK!!!  SAME DAY Appointments for ACUTE CARE  Such as: Sprains, Injuries, cuts, abrasions, rashes, muscle pain, joint pain, back pain Colds, flu, sore throats, headache, allergies, cough, fever  Ear pain, sinus and eye infections Abdominal pain, nausea, vomiting, diarrhea, upset stomach Animal/insect bites  3 Easy Ways to Schedule: Walk-In Scheduling Call in scheduling Mychart Sign-up: [email or web address]         

## 2016-12-07 NOTE — Progress Notes (Signed)
   Subjective:    Patient ID: Brooke Combs, female    DOB: 07/09/1946, 71 y.o.   MRN: [id number]  HPI Here to follow up on issues. When we saw her a month ago she was experiencing spells of lightheadedness and fatigue and her labs revealed a low vitamin D level. Since starting on OTC vitamin D capsules she feels much better and is very pleased. Her carotids looked clean. We had tried a sligtly lower dose of Synthroid but she wants to get back on 100 mcg like before.    Review of Systems  Constitutional: Negative.   Respiratory: Negative.   Cardiovascular: Negative.   Neurological: Negative.        Objective:   Physical Exam  Constitutional: She is oriented to person, place, and time. She appears well-developed and well-nourished.  Neck: No thyromegaly present.  Cardiovascular: Normal rate, regular rhythm, normal heart sounds and intact distal pulses.   Pulmonary/Chest: Effort normal and breath sounds normal. No respiratory distress. She has no wheezes. She has no rales.  Lymphadenopathy:    She has no cervical adenopathy.  Neurological: She is alert and oriented to person, place, and time.          Assessment & Plan:  For the hypothyroidism we will resume the 100 mcg dose. Continue taking vitamin D and we will recheck a level in 2 more months.  Gershon CraneStephen Fry, MD

## 2017-01-25 ENCOUNTER — Ambulatory Visit (INDEPENDENT_AMBULATORY_CARE_PROVIDER_SITE_OTHER): Payer: Medicare HMO | Admitting: Family Medicine

## 2017-01-25 ENCOUNTER — Encounter: Payer: Self-pay | Admitting: Family Medicine

## 2017-01-25 VITALS — BP 112/66 | Temp 98.2°F | Ht 64.0 in | Wt 115.0 lb

## 2017-01-25 DIAGNOSIS — E039 Hypothyroidism, unspecified: Secondary | ICD-10-CM

## 2017-01-25 DIAGNOSIS — E559 Vitamin D deficiency, unspecified: Secondary | ICD-10-CM

## 2017-01-25 LAB — T3, FREE: T3 FREE: 4.1 pg/mL (ref 2.3–4.2)

## 2017-01-25 LAB — T4, FREE: FREE T4: 1.61 ng/dL — AB (ref 0.60–1.60)

## 2017-01-25 LAB — VITAMIN D 25 HYDROXY (VIT D DEFICIENCY, FRACTURES): VITD: 53.23 ng/mL (ref 30.00–100.00)

## 2017-01-25 LAB — TSH: TSH: <0.01 u[IU]/mL — ABNORMAL LOW (ref 0.35–4.50)

## 2017-01-25 NOTE — Progress Notes (Signed)
   Subjective:    Patient ID: Brooke Combs, female    DOB: Apr 02, 1946, 71 y.o.   MRN: [id number]  HPI Here to follow up hypothyroidism and low vitamin D. She has been taking Synthroid 100 mcg daily and for the past 30 days she has been taking a total of 4000 units of vitamin D daily. She feels great .    Review of Systems  Constitutional: Negative.   Respiratory: Negative.   Cardiovascular: Negative.   Endocrine: Negative.   Neurological: Negative.        Objective:   Physical Exam  Constitutional: She is oriented to person, place, and time. She appears well-developed and well-nourished.  Neck: No thyromegaly present.  Cardiovascular: Normal rate, regular rhythm, normal heart sounds and intact distal pulses.   Pulmonary/Chest: Effort normal and breath sounds normal.  Lymphadenopathy:    She has no cervical adenopathy.  Neurological: She is alert and oriented to person, place, and time.          Assessment & Plan:  Hypothyroidism and low vitamin D. Check lab levels today.  Gershon CraneStephen Alexander Mcauley, MD

## 2017-01-25 NOTE — Patient Instructions (Signed)
WE NOW OFFER   [facility] FAST TRACK!!!  SAME DAY Appointments for ACUTE CARE  Such as: Sprains, Injuries, cuts, abrasions, rashes, muscle pain, joint pain, back pain Colds, flu, sore throats, headache, allergies, cough, fever  Ear pain, sinus and eye infections Abdominal pain, nausea, vomiting, diarrhea, upset stomach Animal/insect bites  3 Easy Ways to Schedule: Walk-In Scheduling Call in scheduling Mychart Sign-up: [email or web address]         

## 2017-01-27 ENCOUNTER — Telehealth: Payer: Self-pay | Admitting: Family Medicine

## 2017-01-27 MED ORDER — DIAZEPAM 5 MG PO TABS: ORAL_TABLET | ORAL | 5 refills | Status: DC

## 2017-01-27 NOTE — Telephone Encounter (Signed)
Call in #90 with 5 rf 

## 2017-01-27 NOTE — Telephone Encounter (Signed)
[facility] 

## 2017-01-27 NOTE — Telephone Encounter (Signed)
Refill request for Valium [facility]

## 2017-03-15 ENCOUNTER — Ambulatory Visit (INDEPENDENT_AMBULATORY_CARE_PROVIDER_SITE_OTHER): Payer: Medicare HMO | Admitting: Family Medicine

## 2017-03-15 ENCOUNTER — Encounter: Payer: Self-pay | Admitting: Family Medicine

## 2017-03-15 ENCOUNTER — Ambulatory Visit (INDEPENDENT_AMBULATORY_CARE_PROVIDER_SITE_OTHER)
Admission: RE | Admit: 2017-03-15 | Discharge: 2017-03-15 | Disposition: A | Discharge status: Home / Self Care | Payer: Medicare HMO | Source: Ambulatory Visit | Attending: Family Medicine | Admitting: Family Medicine

## 2017-03-15 VITALS — BP 120/60 | Temp 98.4°F | Ht 64.0 in | Wt 114.0 lb

## 2017-03-15 DIAGNOSIS — M545 Low back pain, unspecified: Secondary | ICD-10-CM

## 2017-03-15 NOTE — Progress Notes (Signed)
   Subjective:    Patient ID: Brooke Combs, female    DOB: Nov 27, 1945, 71 y.o.   MRN: [id number]  HPI Here for intermittent sharp pains in the left lower back over the past 2 months. These start after she works in her yard trimming bushes or picking up sticks. It then lasts for a few hours to a few days and eases away. Heat and Aleve help. Most of the time it does not bother her. No pain in the legs.    Review of Systems  Constitutional: Negative.   Respiratory: Negative.   Cardiovascular: Negative.   Gastrointestinal: Negative.   Genitourinary: Negative.   Musculoskeletal: Positive for back pain.       Objective:   Physical Exam  Constitutional: She appears well-developed and well-nourished. No distress.  Cardiovascular: Normal rate, regular rhythm, normal heart sounds and intact distal pulses.   Pulmonary/Chest: Effort normal and breath sounds normal. No respiratory distress. She has no wheezes. She has no rales.  Musculoskeletal:  Mildly tender in the left lower back. Full ROM. Negative SLR.           Assessment & Plan:  Low back pain, probably a pinched nerve. We will get an Xray of the LS spine. Use stretches and Aleve when this happens.  Gershon CraneStephen Fry, MD

## 2017-03-15 NOTE — Patient Instructions (Signed)
WE NOW OFFER   [facility] FAST TRACK!!!  SAME DAY Appointments for ACUTE CARE  Such as: Sprains, Injuries, cuts, abrasions, rashes, muscle pain, joint pain, back pain Colds, flu, sore throats, headache, allergies, cough, fever  Ear pain, sinus and eye infections Abdominal pain, nausea, vomiting, diarrhea, upset stomach Animal/insect bites  3 Easy Ways to Schedule: Walk-In Scheduling Call in scheduling Mychart Sign-up: [email or web address]         

## 2017-03-18 DIAGNOSIS — R69 Illness, unspecified: Secondary | ICD-10-CM | POA: Diagnosis not present

## 2017-04-05 DIAGNOSIS — R69 Illness, unspecified: Secondary | ICD-10-CM | POA: Diagnosis not present

## 2017-05-12 ENCOUNTER — Other Ambulatory Visit: Payer: Self-pay | Admitting: Family Medicine

## 2017-06-18 ENCOUNTER — Other Ambulatory Visit: Payer: Self-pay | Admitting: Family Medicine

## 2017-07-31 ENCOUNTER — Other Ambulatory Visit: Payer: Self-pay | Admitting: Family Medicine

## 2017-08-07 ENCOUNTER — Other Ambulatory Visit: Payer: Self-pay | Admitting: Family Medicine

## 2017-08-07 NOTE — Telephone Encounter (Signed)
Last OV 03/15/2017.  Rx was last refilled 01/27/2017 disp 90 with 5 refills   Sent to PCP for approval.

## 2017-08-09 ENCOUNTER — Other Ambulatory Visit: Payer: Self-pay | Admitting: Family Medicine

## 2017-08-10 ENCOUNTER — Telehealth: Payer: Self-pay

## 2017-08-10 MED ORDER — SIMVASTATIN 40 MG PO TABS: 40.0000 mg | ORAL_TABLET | Freq: Every day | ORAL | 1 refills | Status: DC

## 2017-08-10 NOTE — Telephone Encounter (Signed)
Requesting a 90 day supply for simvastatin  Last refilled 08/09/2017 disp 30 with 5 refills   Rx sent for a 90 day supply

## 2017-09-02 ENCOUNTER — Other Ambulatory Visit: Payer: Self-pay | Admitting: Family Medicine

## 2017-09-21 ENCOUNTER — Other Ambulatory Visit: Payer: Self-pay | Admitting: Family Medicine

## 2017-09-21 ENCOUNTER — Telehealth: Payer: Self-pay | Admitting: Family Medicine

## 2017-09-21 MED ORDER — ALBUTEROL SULFATE HFA 108 (90 BASE) MCG/ACT IN AERS: INHALATION_SPRAY | RESPIRATORY_TRACT | 3 refills | Status: DC

## 2017-09-21 NOTE — Telephone Encounter (Signed)
Copied from CRM [phone]. Topic: Quick Communication - Rx Refill/Question >> Sep 21, 2017  9:37 AM Raquel SarnaHayes, Teresa G wrote: [facility] Fl[facility]ent [facility]   [id number] [place] [id number] [place] [id number] Phone: [phone] Fax: [phone]

## 2017-09-22 NOTE — Telephone Encounter (Signed)
Last OV 03/15/2017   Sent to PCP for approval

## 2017-10-03 DIAGNOSIS — M7711 Lateral epicondylitis, right elbow: Secondary | ICD-10-CM | POA: Diagnosis not present

## 2017-10-03 DIAGNOSIS — M25521 Pain in right elbow: Secondary | ICD-10-CM | POA: Diagnosis not present

## 2017-10-15 ENCOUNTER — Other Ambulatory Visit: Payer: Self-pay | Admitting: Family Medicine

## 2017-10-16 ENCOUNTER — Other Ambulatory Visit: Payer: Self-pay | Admitting: Family Medicine

## 2017-10-16 NOTE — Telephone Encounter (Signed)
Rx refill request: Flovent inhaler  LOV: 09/13/16 ( since mentioned in chart- multiple acute visits)  PCP: Clent RidgesFry  Pharmacy: verified

## 2017-10-16 NOTE — Telephone Encounter (Signed)
Copied from CRM [phone]. Topic: Quick Communication - Rx Refill/Question >> Oct 16, 2017  8:52 AM Cipriano BunkerLambe, Annette S wrote:  Medication:  FLOVENT HFA 44 MCG/ACT inhaler albuterol (PROVENTIL HFA;VENTOLIN HFA) 108 (90 Base) MCG/ACT inhaler  Has the patient contacted their pharmacy? Yes.    (Agent: If no, request that the patient contact the pharmacy for the refill.) Preferred Pharmacy (with phone number or street name):  [id number] [name] - [id number] [place] [address] Phone: [phone] Fax: [phone]   Agent: Please be advised that RX refills may take up to 3 business days. We ask that you follow-up with your pharmacy.

## 2017-10-17 MED ORDER — FLUTICASONE PROPIONATE HFA 44 MCG/ACT IN AERO: 2.0000 | INHALATION_SPRAY | Freq: Two times a day (BID) | RESPIRATORY_TRACT | 5 refills | Status: DC

## 2017-10-17 NOTE — Telephone Encounter (Signed)
Sent to PCP for approval.  

## 2017-10-31 ENCOUNTER — Ambulatory Visit (INDEPENDENT_AMBULATORY_CARE_PROVIDER_SITE_OTHER): Payer: Medicare HMO | Admitting: Family Medicine

## 2017-10-31 ENCOUNTER — Encounter: Payer: Self-pay | Admitting: Family Medicine

## 2017-10-31 VITALS — BP 120/68 | HR 85 | Temp 98.3°F | Ht 64.0 in | Wt 119.0 lb

## 2017-10-31 DIAGNOSIS — E039 Hypothyroidism, unspecified: Secondary | ICD-10-CM

## 2017-10-31 DIAGNOSIS — F411 Generalized anxiety disorder: Secondary | ICD-10-CM

## 2017-10-31 DIAGNOSIS — E782 Mixed hyperlipidemia: Secondary | ICD-10-CM

## 2017-10-31 DIAGNOSIS — J438 Other emphysema: Secondary | ICD-10-CM | POA: Diagnosis not present

## 2017-10-31 DIAGNOSIS — R69 Illness, unspecified: Secondary | ICD-10-CM | POA: Diagnosis not present

## 2017-10-31 LAB — BASIC METABOLIC PANEL
BUN: 14 mg/dL (ref 6–23)
CALCIUM: 9.6 mg/dL (ref 8.4–10.5)
CO2: 29 meq/L (ref 19–32)
CREATININE: 0.73 mg/dL (ref 0.40–1.20)
Chloride: 107 mEq/L (ref 96–112)
GFR: 83.72 mL/min (ref >60.00)
GLUCOSE: 97 mg/dL (ref 70–99)
Potassium: 4.6 mEq/L (ref 3.5–5.1)
Sodium: 142 mEq/L (ref 135–145)

## 2017-10-31 LAB — HEPATIC FUNCTION PANEL
ALK PHOS: 56 U/L (ref 39–117)
ALT: 8 U/L (ref 0–35)
AST: 12 U/L (ref 0–37)
Albumin: 4.2 g/dL (ref 3.5–5.2)
BILIRUBIN DIRECT: 0.2 mg/dL (ref 0.0–0.3)
BILIRUBIN TOTAL: 1.2 mg/dL (ref 0.2–1.2)
Total Protein: 6.6 g/dL (ref 6.0–8.3)

## 2017-10-31 LAB — LIPID PANEL
CHOL/HDL RATIO: 3
CHOLESTEROL: 165 mg/dL (ref 0–200)
HDL: 55.5 mg/dL (ref >39.00)
LDL CALC: 93 mg/dL (ref 0–99)
NonHDL: 109.15
TRIGLYCERIDES: 80 mg/dL (ref 0.0–149.0)
VLDL: 16 mg/dL (ref 0.0–40.0)

## 2017-10-31 LAB — TSH: TSH: <0.01 u[IU]/mL — ABNORMAL LOW (ref 0.35–4.50)

## 2017-10-31 LAB — T3, FREE: T3, Free: 3.7 pg/mL (ref 2.3–4.2)

## 2017-10-31 LAB — T4, FREE: Free T4: 1.56 ng/dL (ref 0.60–1.60)

## 2017-10-31 NOTE — Progress Notes (Signed)
   Subjective:    Patient ID: Brooke Combs, female    DOB: 10/24/1945, 72 y.o.   MRN: [id number]  HPI Here to follow up on thyroid disease and on HTN. She feels well.    Review of Systems  Constitutional: Negative.   HENT: Negative.   Respiratory: Negative.   Cardiovascular: Negative.   Endocrine: Negative.   Psychiatric/Behavioral: Negative.        Objective:   Physical Exam  Constitutional: She is oriented to person, place, and time. She appears well-developed and well-nourished.  Neck: No thyromegaly present.  Cardiovascular: Normal rate, regular rhythm, normal heart sounds and intact distal pulses.  Pulmonary/Chest: Effort normal and breath sounds normal. No respiratory distress. She has no wheezes. She has no rales.  Lymphadenopathy:    She has no cervical adenopathy.  Neurological: She is alert and oriented to person, place, and time.  Psychiatric: She has a normal mood and affect. Her behavior is normal. Thought content normal.          Assessment & Plan:  She seems to be doing well. Get labs today.  Gershon CraneStephen Fry, MD

## 2017-11-28 ENCOUNTER — Other Ambulatory Visit: Payer: Self-pay | Admitting: Family Medicine

## 2017-12-18 ENCOUNTER — Telehealth: Payer: Self-pay | Admitting: Family Medicine

## 2017-12-18 DIAGNOSIS — R059 Cough, unspecified: Secondary | ICD-10-CM

## 2017-12-18 DIAGNOSIS — R05 Cough: Secondary | ICD-10-CM

## 2017-12-18 NOTE — Telephone Encounter (Signed)
[facility]. She can simply walk in at the time she mentioned

## 2017-12-18 NOTE — Telephone Encounter (Signed)
Copied from CRM [phone]. Topic: Quick Communication - See Telephone Encounter >> Dec 18, 2017  9:55 AM Rudi CocoLathan, Mae Denunzio M, NT wrote: CRM for notification. See Telephone encounter for: 12/18/17.  Pt. Calling to speak with Dr. Clent RidgesFry about having a chest x ray done this Friday 12/22/17 around 11am. Pt. Stated that she spoke with Dr. Clent RidgesFry about having this done about a month ago.pt. Lives an hour away and wanted to come for appt. On Friday. Pt. Can be reached at [phone] (

## 2017-12-18 NOTE — Telephone Encounter (Signed)
Sent to PCP to advise 

## 2018-01-26 ENCOUNTER — Ambulatory Visit (INDEPENDENT_AMBULATORY_CARE_PROVIDER_SITE_OTHER): Payer: Medicare HMO | Admitting: Family Medicine

## 2018-01-26 ENCOUNTER — Encounter: Payer: Self-pay | Admitting: Family Medicine

## 2018-01-26 VITALS — BP 120/72 | HR 97 | Temp 98.4°F | Ht 64.0 in | Wt 117.6 lb

## 2018-01-26 DIAGNOSIS — R079 Chest pain, unspecified: Secondary | ICD-10-CM | POA: Diagnosis not present

## 2018-01-26 MED ORDER — ESOMEPRAZOLE MAGNESIUM 20 MG PO CPDR: 20.0000 mg | DELAYED_RELEASE_CAPSULE | Freq: Two times a day (BID) | ORAL | 0 refills | Status: DC

## 2018-01-26 MED ORDER — ESOMEPRAZOLE MAGNESIUM 20 MG PO CPDR: 20.0000 mg | DELAYED_RELEASE_CAPSULE | Freq: Every day | ORAL | 0 refills | Status: DC

## 2018-01-26 NOTE — Progress Notes (Signed)
   Subjective:    Patient ID: Brooke Combs, female    DOB: 16-May-1946, 72 y.o.   MRN: [id number]  HPI Here to discuss an episode of chest pain 2 nights ago. While sitting on her cough watching TV she suddenly felt a severe pressure type pain in the center of her chest, and this pain then radiated up her neck and into both lower jaws. It lasted about 2 hours and then faded away. There was no SOB or sweats or nausea. She has felt fine ever since. She still takes Nexium daily and she has not felt any heartburn for a long while. She is trying to quit smoking and her last cigarette was about 2 weeks ago. She has never had any type of stress testing.    Review of Systems  Constitutional: Negative.   Respiratory: Negative.   Cardiovascular: Positive for chest pain. Negative for palpitations and leg swelling.  Neurological: Negative.        Objective:   Physical Exam  Constitutional: She is oriented to person, place, and time. She appears well-developed and well-nourished.  Cardiovascular: Normal rate, regular rhythm, normal heart sounds and intact distal pulses.  EKG is normal   Pulmonary/Chest: Effort normal and breath sounds normal. No stridor. No respiratory distress. She has no wheezes. She has no rales.  Neurological: She is alert and oriented to person, place, and time.          Assessment & Plan:  She had an episode of chest pain that could either be from esophageal spasm or from angina. She will increase her daily Nexium dosing to 20 mg BID. She is already on 81 mg of aspirin daily. We will set up a Myoview stress test for next week.  Gershon CraneStephen Fry, MD

## 2018-01-27 IMAGING — DX DG CHEST 2V
2 series · 2 of 2 positions shown · non-contrast
Comparison: None.

CLINICAL DATA: Cough.  Smoker

EXAM:
CHEST  2 VIEW

[chest pa]
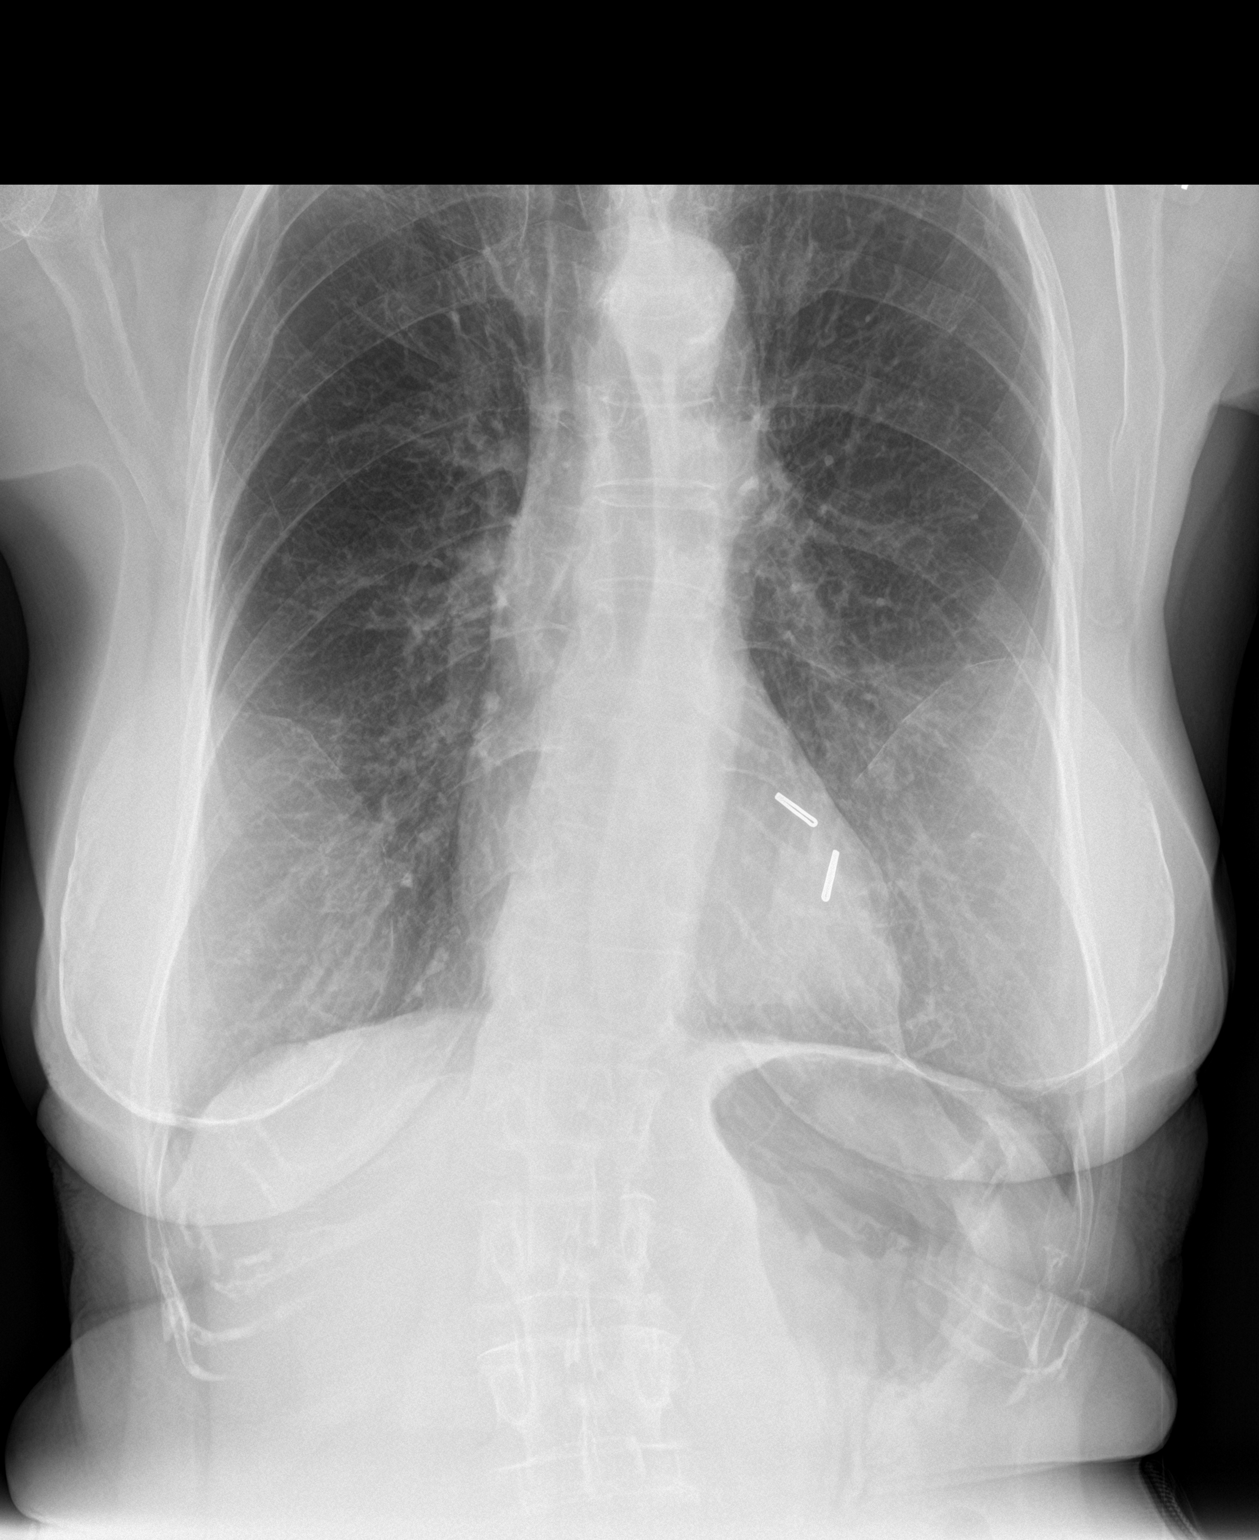

[chest lat]
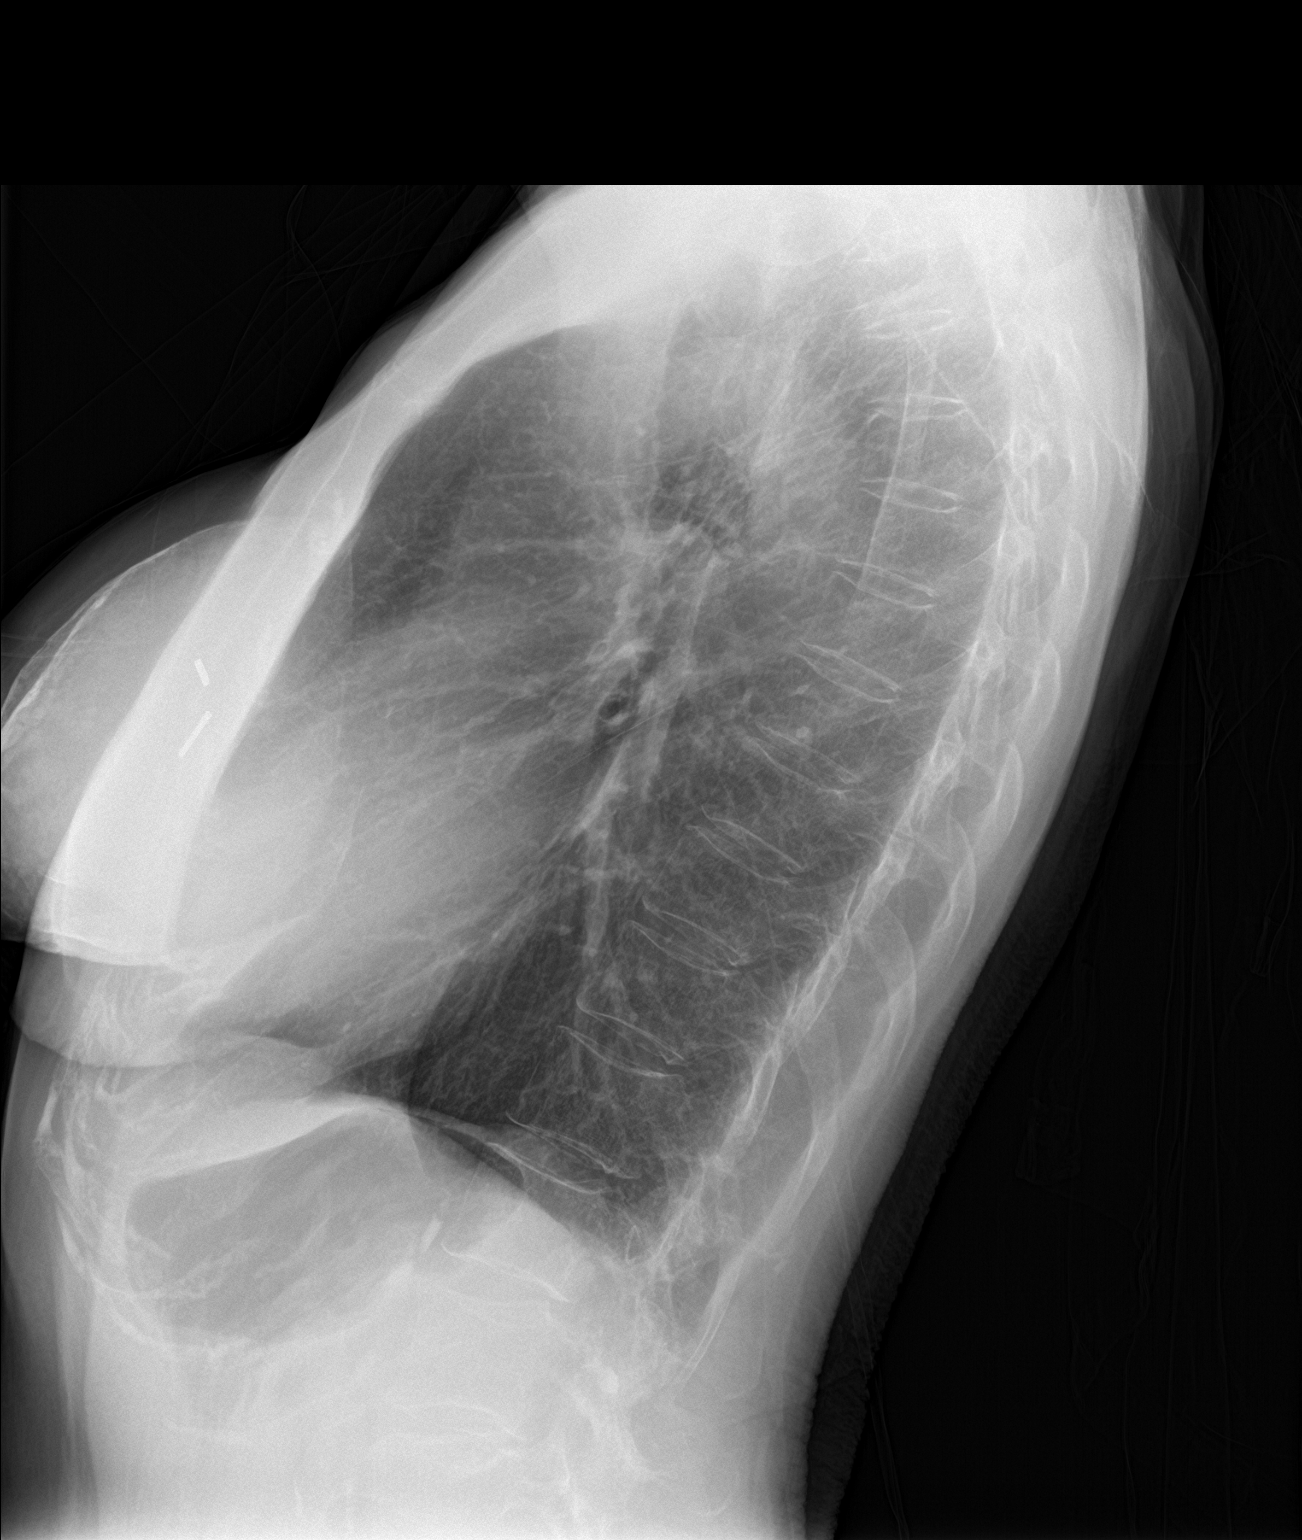

[2 of 2 positions shown; findings below may reference images not displayed]

FINDINGS: COPD with marked hyperinflation of lungs and emphysema. Negative for
pneumonia. Negative for heart failure or effusion. No mass lesion.
Apical scarring bilaterally. Calcified breast implants bilaterally.
IMPRESSION: Severe COPD.  No acute cardiopulmonary abnormality.

## 2018-01-31 ENCOUNTER — Telehealth (HOSPITAL_COMMUNITY): Payer: Self-pay | Admitting: *Deleted

## 2018-01-31 NOTE — Telephone Encounter (Signed)
Left message on voicemail in reference to upcoming appointment scheduled for 02/02/18 Phone number given for a call back so details instructions can be given. Ricky AlaSmith, Djimon Lundstrom Jacqueline

## 2018-01-31 NOTE — Telephone Encounter (Signed)
Patient given detailed instructions per Myocardial Perfusion Study Information Sheet for the test on 02/02/18. Patient notified to arrive 15 minutes early and that it is imperative to arrive on time for appointment to keep from having the test rescheduled.  If you need to cancel or reschedule your appointment, please call the office within 24 hours of your appointment. . Patient verbalized understanding. Myrle Wanek Jacqueline   

## 2018-02-02 ENCOUNTER — Ambulatory Visit (HOSPITAL_COMMUNITY): Payer: Medicare HMO | Attending: Cardiovascular Disease

## 2018-02-02 DIAGNOSIS — R079 Chest pain, unspecified: Secondary | ICD-10-CM | POA: Insufficient documentation

## 2018-02-02 LAB — MYOCARDIAL PERFUSION IMAGING
CHL CUP NUCLEAR SSS: 25
CHL CUP RESTING HR STRESS: 78 {beats}/min
CSEPED: 5 min
CSEPEW: 7 METS
CSEPHR: 94 %
Exercise duration (sec): 0 s
LV dias vol: 54 mL (ref 46–106)
LV sys vol: 13 mL
MPHR: 150 {beats}/min
Peak HR: 142 {beats}/min
RATE: 0.32
SDS: 7
SRS: 18
TID: 0.99

## 2018-02-02 MED ORDER — TECHNETIUM TC 99M TETROFOSMIN IV KIT
9.6000 | PACK | Freq: Once | INTRAVENOUS | Status: AC | PRN
  Administered 2018-02-02: 9.6 via INTRAVENOUS
  Filled 2018-02-02: qty 10

## 2018-02-02 MED ORDER — TECHNETIUM TC 99M TETROFOSMIN IV KIT
31.1000 | PACK | Freq: Once | INTRAVENOUS | Status: AC | PRN
  Administered 2018-02-02: 31.1 via INTRAVENOUS
  Filled 2018-02-02: qty 32

## 2018-02-09 ENCOUNTER — Telehealth: Payer: Self-pay | Admitting: Family Medicine

## 2018-02-09 NOTE — Telephone Encounter (Signed)
Copied from CRM [phone]. Topic: Quick Communication - See Telephone Encounter >> Feb 09, 2018  9:34 AM Tamela OddiMartin, Don'Quashia, NT wrote: CRM for notification. See Telephone encounter for: 02/09/18. Patient called and is inquiring about her stress test results . Patient is requesting a call back.  CB# [phone]

## 2018-02-09 NOTE — Telephone Encounter (Signed)
Results are in sent to PCP to advise what to inform the pt.

## 2018-02-09 NOTE — Telephone Encounter (Signed)
She has been informed of the results.

## 2018-02-11 ENCOUNTER — Other Ambulatory Visit: Payer: Self-pay | Admitting: Family Medicine

## 2018-02-12 ENCOUNTER — Other Ambulatory Visit: Payer: Self-pay

## 2018-03-05 ENCOUNTER — Other Ambulatory Visit: Payer: Self-pay | Admitting: Family Medicine

## 2018-03-05 NOTE — Telephone Encounter (Signed)
Last fill 08/10/17 Last OV 01/26/18  Ok to fill?

## 2018-03-06 NOTE — Telephone Encounter (Signed)
Call in #90 with 5 rf 

## 2018-03-06 NOTE — Telephone Encounter (Signed)
Rx has been faxed in.  

## 2018-03-09 DIAGNOSIS — R69 Illness, unspecified: Secondary | ICD-10-CM | POA: Diagnosis not present

## 2018-03-14 ENCOUNTER — Telehealth: Payer: Self-pay | Admitting: Family Medicine

## 2018-03-14 NOTE — Telephone Encounter (Signed)
I do not know what happened to the written rx. Please call in #90 with 5 rf to her pharmacy

## 2018-03-14 NOTE — Telephone Encounter (Signed)
Dr. Windy FastFry--please advise on refill of valium.  Looks like the last rx was printed out on 03/06/18.  I have called the pharmacy and the last prescription that they filled was back in January and those refills have expired.    Pt is requesting a refill of this medication.

## 2018-03-14 NOTE — Telephone Encounter (Signed)
Copied from CRM #[id number]. Topic: Quick Communication - Rx Refill/Question >> Mar 14, 2018  9:36 AM Alexander BergeronBarksdale, Brooke B wrote: Medication: diazepam (VALIUM) 5 MG tablet [[id number]][[id number]]   Has the patient contacted their pharmacy? Yes.   (Agent: If no, request that the patient contact the pharmacy for the refill.) (Agent: If yes, when and what did the pharmacy advise?)  [facility]  Agent: Please be advised that RX refills may take up to 3 business days. We ask that you follow-up with your pharmacy.

## 2018-03-18 ENCOUNTER — Other Ambulatory Visit: Payer: Self-pay | Admitting: Family Medicine

## 2018-03-20 MED ORDER — DIAZEPAM 5 MG PO TABS: 5.0000 mg | ORAL_TABLET | Freq: Three times a day (TID) | ORAL | 5 refills | Status: DC | PRN

## 2018-03-20 NOTE — Telephone Encounter (Signed)
Refill has been called to the pharmacy. Nothing further is needed.  

## 2018-03-20 NOTE — Telephone Encounter (Signed)
Last OV 01/26/2018   Last refilled 03/06/2018 disp 90 with 5 refills   Rx was printed call pt's pharmacy

## 2018-04-06 DIAGNOSIS — R69 Illness, unspecified: Secondary | ICD-10-CM | POA: Diagnosis not present

## 2018-04-24 ENCOUNTER — Encounter: Payer: Self-pay | Admitting: Family Medicine

## 2018-04-24 ENCOUNTER — Ambulatory Visit (INDEPENDENT_AMBULATORY_CARE_PROVIDER_SITE_OTHER): Payer: Medicare HMO | Admitting: Family Medicine

## 2018-04-24 VITALS — BP 118/64 | HR 82 | Temp 98.3°F | Wt 117.2 lb

## 2018-04-24 DIAGNOSIS — M545 Low back pain, unspecified: Secondary | ICD-10-CM

## 2018-04-24 MED ORDER — CYCLOBENZAPRINE HCL 10 MG PO TABS: 10.0000 mg | ORAL_TABLET | Freq: Three times a day (TID) | ORAL | 2 refills | Status: DC | PRN

## 2018-04-24 MED ORDER — TRAMADOL HCL 50 MG PO TABS: 100.0000 mg | ORAL_TABLET | Freq: Four times a day (QID) | ORAL | 0 refills | Status: DC | PRN

## 2018-04-24 MED ORDER — METHYLPREDNISOLONE 4 MG PO TBPK: ORAL_TABLET | ORAL | 0 refills | Status: DC

## 2018-04-24 NOTE — Progress Notes (Signed)
   Subjective:    Patient ID: [name], female    DOB: [birth year], 72 y.o.   MRN: [id number]  HPI Here for 6 days of spasm and sharp pains in the right lower back. These involve the buttock but not the leg. No recent trauma, but prior to the onset of the pain she spent several days stooping over packing clothes into boxes for donation purposes. She has tried heat, Aleve, and Ibuprofen with no relief.    Review of Systems  Constitutional: Negative.   Respiratory: Negative.   Cardiovascular: Negative.   Musculoskeletal: Positive for back pain.  Neurological: Negative.        Objective:   Physical Exam  Constitutional: She is oriented to person, place, and time.  In pain   Cardiovascular: Normal rate, regular rhythm, normal heart sounds and intact distal pulses.  Pulmonary/Chest: Effort normal and breath sounds normal.  Musculoskeletal:  She is tender in the right lower back and over the right sciatic notch. ROM is full, SLR are negative   Neurological: She is alert and oriented to person, place, and time.          Assessment & Plan:  Low back pain. She will try a Medrol dose pack and Flexeril. Add Tramadol prn. Gershon Crane, MD

## 2018-04-30 ENCOUNTER — Ambulatory Visit (INDEPENDENT_AMBULATORY_CARE_PROVIDER_SITE_OTHER): Payer: Medicare HMO | Admitting: Family Medicine

## 2018-04-30 ENCOUNTER — Encounter: Payer: Self-pay | Admitting: Family Medicine

## 2018-04-30 VITALS — BP 120/62 | HR 95 | Temp 98.3°F | Wt 119.3 lb

## 2018-04-30 DIAGNOSIS — S32020A Wedge compression fracture of second lumbar vertebra, initial encounter for closed fracture: Secondary | ICD-10-CM

## 2018-04-30 DIAGNOSIS — M5441 Lumbago with sciatica, right side: Secondary | ICD-10-CM | POA: Diagnosis not present

## 2018-04-30 MED ORDER — HYDROCODONE-ACETAMINOPHEN 5-325 MG PO TABS: 1.0000 | ORAL_TABLET | ORAL | 0 refills | Status: DC | PRN

## 2018-04-30 NOTE — Progress Notes (Signed)
   Subjective:    Patient ID: Brooke Combs, female    DOB: 1946-02-26, 72 y.o.   MRN: [id number]  HPI Here for worsening right sided low back pain that radiates down the right leg. The pain has gotten worse and makes it difficult to walk or get out of bed. She has been using Flexeril, a steroid taper, and Tramadol with no relief.    Review of Systems  Constitutional: Negative.   Respiratory: Negative.   Cardiovascular: Negative.   Musculoskeletal: Positive for back pain.       Objective:   Physical Exam  Constitutional: She is oriented to person, place, and time.  In pain   Cardiovascular: Normal rate, regular rhythm, normal heart sounds and intact distal pulses.  Pulmonary/Chest: Effort normal and breath sounds normal.  Musculoskeletal:  Very tender in the right lower back and over the right sciatic notch. Full ROM   Neurological: She is alert and oriented to person, place, and time.          Assessment & Plan:  Right sided sciatica. Use Norco for pain. Set up an MRI scan of the lumbar spine soon.  Gershon Crane, MD

## 2018-05-04 ENCOUNTER — Telehealth: Payer: Self-pay | Admitting: Family Medicine

## 2018-05-04 NOTE — Telephone Encounter (Signed)
Copied from CRM [id number]. Topic: Quick Communication - See Telephone Encounter >> May 04, 2018  8:37 AM Jolayne Haines L wrote: CRM for notification. See Telephone encounter for: 05/04/18.  Patient states she will be out of her HYDROcodone-acetaminophen (NORCO) 5-325 MG tablet before her MRI on 10/22. The advised her it would be atleast 48-72 hours before they know the results. She would like a nurse to call her back

## 2018-05-04 NOTE — Telephone Encounter (Signed)
Dr. Fry please advise. Thanks  

## 2018-05-08 ENCOUNTER — Ambulatory Visit
Admission: RE | Admit: 2018-05-08 | Discharge: 2018-05-08 | Disposition: A | Discharge status: Home / Self Care | Payer: Medicare HMO | Source: Ambulatory Visit | Attending: Family Medicine | Admitting: Family Medicine

## 2018-05-08 DIAGNOSIS — M5441 Lumbago with sciatica, right side: Secondary | ICD-10-CM

## 2018-05-08 DIAGNOSIS — M5126 Other intervertebral disc displacement, lumbar region: Secondary | ICD-10-CM | POA: Diagnosis not present

## 2018-05-08 MED ORDER — HYDROCODONE-ACETAMINOPHEN 5-325 MG PO TABS: 1.0000 | ORAL_TABLET | ORAL | 0 refills | Status: DC | PRN

## 2018-05-08 NOTE — Telephone Encounter (Signed)
I sent in another #30

## 2018-05-08 NOTE — Telephone Encounter (Signed)
I have called and lmom to make the pt aware of meds that have been sent to her pharmacy

## 2018-05-09 NOTE — Addendum Note (Signed)
Addended by: Gershon Crane A on: 05/09/2018 10:31 AM   Modules accepted: Orders

## 2018-05-14 ENCOUNTER — Other Ambulatory Visit: Payer: Self-pay | Admitting: Family Medicine

## 2018-05-14 DIAGNOSIS — S32020A Wedge compression fracture of second lumbar vertebra, initial encounter for closed fracture: Secondary | ICD-10-CM

## 2018-06-11 ENCOUNTER — Other Ambulatory Visit: Payer: Self-pay | Admitting: Family Medicine

## 2018-06-15 ENCOUNTER — Other Ambulatory Visit: Payer: Self-pay | Admitting: Family Medicine

## 2018-06-15 NOTE — Telephone Encounter (Signed)
Requested Prescriptions  Pending Prescriptions Disp Refills  . albuterol (PROVENTIL HFA;VENTOLIN HFA) 108 (90 Base) MCG/ACT inhaler [Pharmacy Med Name: ALBUTEROL HFA (VENTOLIN) INH] 18 Inhaler 3    Sig: INHALE 2 PUFFS EVERY 4 HOURS AS NEEDED FOR WHEEZING/SHORTNESS OF BREATH     Pulmonology:  Beta Agonists Failed - 06/15/2018  8:38 AM      Failed - One inhaler should last at least one month. If the patient is requesting refills earlier, contact the patient to check for uncontrolled symptoms.      Passed - Valid encounter within last 12 months    Recent Outpatient Visits          1 month ago Right-sided low back pain with right-sided sciatica, unspecified chronicity   [facility] Tera MaterStephen A, [facility]   1 month ago Acute right-sided low back pain without sciatica   [facility], Tera MaterStephen A, MD   4 months ago Chest pain, unspecified type   [facility], Tera MaterStephen A, MD   7 months ago Mixed hyperlipidemia   [facility], Tera MaterStephen A, MD   1 year ago Acute left-sided low back pain without sciatica   [facility], Tera MaterStephen A, MD

## 2018-06-15 NOTE — Telephone Encounter (Signed)
Copied from CRM [id number]. Topic: Quick Communication - Rx Refill/Question >> Jun 15, 2018  8:37 AM Percival SpanishKennedy, Cheryl W wrote: Medication   VENTOLIN HFA 108 (90 Base) MCG/ACT inhaler and Albuterol   Has the patient contacted their pharmacy yes    [facility] [address]   Agent: Please be advised that RX refills may take up to 3 business days. We ask that you follow-up with your pharmacy.

## 2018-07-16 ENCOUNTER — Telehealth: Payer: Self-pay | Admitting: Family Medicine

## 2018-07-16 MED ORDER — OMEPRAZOLE 40 MG PO CPDR: 40.0000 mg | DELAYED_RELEASE_CAPSULE | Freq: Every day | ORAL | 3 refills | Status: DC

## 2018-07-16 NOTE — Telephone Encounter (Signed)
Dr. Fry please advise. Thanks  

## 2018-07-16 NOTE — Telephone Encounter (Signed)
Agreed. She should stop the OTC med and start on Omeprazole 40 mg daily. Call in #90 with 3 rf

## 2018-07-16 NOTE — Telephone Encounter (Signed)
Copied from CRM [id number]. Topic: Quick Communication - Rx Refill/Question >> Jul 16, 2018  8:41 AM Lynne LoganHudson, Caryn D wrote: Medication: Pt stated that she has been taking Nexium over the counter and has now started to double up on the dosage per Dr. Claris CheFry's suggestion. That is costing her $50 a month. She spoke with her insurance company and they would cover either of the following medications - omeprazole or pantoprazole. She would like to know if Dr. Clent RidgesFry can send in a Rx for either one of these medications to save her some money. Please advise.  Has the patient contacted their pharmacy? Yes.   (Agent: If no, request that the patient contact the pharmacy for the refill.) (Agent: If yes, when and what did the pharmacy advise?)  Preferred Pharmacy (with phone number or street name[id number] [name] - [id number] [phone] (Phone) [phone])  Agent: Please be advised that RX refills may take up to 3 business days. We ask that you follow-up with your pharmacy.

## 2018-07-16 NOTE — Telephone Encounter (Signed)
I have called the pt and she is aware of rx that has been sent to the pharmacy.  Nothing further is needed.

## 2018-09-14 ENCOUNTER — Other Ambulatory Visit: Payer: Self-pay | Admitting: Family Medicine

## 2018-09-24 ENCOUNTER — Other Ambulatory Visit: Payer: Self-pay | Admitting: Family Medicine

## 2018-10-08 ENCOUNTER — Other Ambulatory Visit: Payer: Self-pay | Admitting: Family Medicine

## 2018-10-08 NOTE — Telephone Encounter (Signed)
Call in #90 with 5 rf 

## 2018-10-08 NOTE — Telephone Encounter (Signed)
Dr. Clent Ridges please advise on refill of the diazepam.  Pt will call back in a couple of weeks to reschedule her appt.  She was coming in to have yearly labs done at her appt that was scheduled for 3/25.

## 2018-10-09 MED ORDER — DIAZEPAM 5 MG PO TABS: 5.0000 mg | ORAL_TABLET | Freq: Three times a day (TID) | ORAL | 5 refills | Status: DC | PRN

## 2018-10-09 NOTE — Telephone Encounter (Signed)
Refill has been called to the pharmacy and left on the VM.  

## 2018-10-10 ENCOUNTER — Ambulatory Visit: Payer: Medicare HMO | Admitting: Family Medicine

## 2018-10-25 ENCOUNTER — Ambulatory Visit (INDEPENDENT_AMBULATORY_CARE_PROVIDER_SITE_OTHER): Payer: Medicare HMO | Admitting: Family Medicine

## 2018-10-25 ENCOUNTER — Other Ambulatory Visit: Payer: Self-pay

## 2018-10-25 ENCOUNTER — Encounter: Payer: Self-pay | Admitting: Family Medicine

## 2018-10-25 DIAGNOSIS — M79672 Pain in left foot: Secondary | ICD-10-CM

## 2018-10-25 DIAGNOSIS — M79671 Pain in right foot: Secondary | ICD-10-CM | POA: Diagnosis not present

## 2018-10-25 MED ORDER — METHYLPREDNISOLONE 4 MG PO TBPK: ORAL_TABLET | ORAL | 0 refills | Status: DC

## 2018-10-25 NOTE — Progress Notes (Signed)
Subjective:    Patient ID: Brooke Combs, female    DOB: 30-Mar-1946, 73 y.o.   MRN: [id number]  HPI Virtual Visit via Video Note  I connected with the patient on 10/25/18 at 10:30 AM EDT by a video enabled telemedicine application and verified that I am speaking with the correct person using two identifiers.  Location patient: home Location provider:work or home office Persons participating in the virtual visit: patient, provider  I discussed the limitations of evaluation and management by telemedicine and the availability of in person appointments. The patient expressed understanding and agreed to proceed.   HPI: For 3 days she has had swelling and pain in the backs of both heels and in both ankles. No recent trauma. No SOB. She says the pain reminds her of the plantar fasciitis pain she had last year except it is in a different location.    ROS: See pertinent positives and negatives per HPI.  Past Medical History:  Diagnosis Date  . Anxiety   . History of Graves' disease    had radioactive iodine ablation   . Hyperlipidemia   . Thyroid disease    hypothyroidism    History reviewed. No pertinent surgical history.  Family History  Problem Relation Age of Onset  . Lupus Sister   . Hypertension Sister      Current Outpatient Medications:  .  albuterol (PROVENTIL HFA;VENTOLIN HFA) 108 (90 Base) MCG/ACT inhaler, INHALE 2 PUFFS EVERY 4 HOURS AS NEEDED FOR WHEEZING/SHORTNESS OF BREATH, Disp: 18 Inhaler, Rfl: 3 .  albuterol (VENTOLIN HFA) 108 (90 Base) MCG/ACT inhaler, INHALE 2 PUFFS EVERY 4 HOURS AS NEEDED FOR WHEEZING/SHORTNESS OF BREATH, Disp: 18 Inhaler, Rfl: 3 .  aspirin 81 MG tablet, Take 81 mg by mouth as needed. , Disp: , Rfl:  .  Cholecalciferol (VITAMIN D) 2000 units CAPS, Take by mouth daily., Disp: , Rfl:  .  cyclobenzaprine (FLEXERIL) 10 MG tablet, Take 1 tablet (10 mg total) by mouth 3 (three) times daily as needed for muscle spasms., Disp: 60 tablet, Rfl:  2 .  diazepam (VALIUM) 5 MG tablet, Take 1 tablet (5 mg total) by mouth every 8 (eight) hours as needed., Disp: 90 tablet, Rfl: 5 .  esomeprazole (NEXIUM) 20 MG capsule, Take 1 capsule (20 mg total) by mouth 2 (two) times daily before a meal. Over then counter, Disp: 1 capsule, Rfl: 0 .  FLOVENT HFA 44 MCG/ACT inhaler, TAKE 2 PUFFS BY MOUTH TWICE A DAY, Disp: 10.6 Inhaler, Rfl: 5 .  HYDROcodone-acetaminophen (NORCO) 5-325 MG tablet, Take 1 tablet by mouth every 4 (four) hours as needed for moderate pain., Disp: 30 tablet, Rfl: 0 .  simvastatin (ZOCOR) 40 MG tablet, TAKE 1 TABLET BY MOUTH EVERYDAY AT BEDTIME, Disp: 90 tablet, Rfl: 0 .  SYNTHROID 100 MCG tablet, TAKE 1 TABLET BY MOUTH EVERY DAY BEFORE BREAKFAST, Disp: 90 tablet, Rfl: 3 .  traMADol (ULTRAM) 50 MG tablet, Take 2 tablets (100 mg total) by mouth every 6 (six) hours as needed for moderate pain., Disp: 60 tablet, Rfl: 0  EXAM:  VITALS per patient if applicable:  GENERAL: alert, oriented, appears well and in no acute distress  HEENT: atraumatic, conjunttiva clear, no obvious abnormalities on inspection of external nose and ears  NECK: normal movements of the head and neck  LUNGS: on inspection no signs of respiratory distress, breathing rate appears normal, no obvious gross SOB, gasping or wheezing  CV: no obvious cyanosis  MS: moves all visible extremities  without noticeable abnormality  PSYCH/NEURO: pleasant and cooperative, no obvious depression or anxiety, speech and thought processing grossly intact  ASSESSMENT AND PLAN: Foot and ankle pain, try a Medrol dose pack. Recheck prn.  Gershon Crane, MD  Discussed the following assessment and plan:  No diagnosis found.     I discussed the assessment and treatment plan with the patient. The patient was provided an opportunity to ask questions and all were answered. The patient agreed with the plan and demonstrated an understanding of the instructions.   The patient was  advised to call back or seek an in-person evaluation if the symptoms worsen or if the condition fails to improve as anticipated.     Review of Systems     Objective:   Physical Exam        Assessment & Plan:

## 2018-12-06 ENCOUNTER — Other Ambulatory Visit: Payer: Self-pay | Admitting: Family Medicine

## 2018-12-14 ENCOUNTER — Other Ambulatory Visit: Payer: Self-pay | Admitting: Family Medicine

## 2018-12-18 ENCOUNTER — Other Ambulatory Visit: Payer: Self-pay | Admitting: Family Medicine

## 2019-01-02 ENCOUNTER — Other Ambulatory Visit: Payer: Self-pay | Admitting: Family Medicine

## 2019-01-09 ENCOUNTER — Telehealth: Payer: Self-pay | Admitting: *Deleted

## 2019-01-09 NOTE — Telephone Encounter (Signed)
Copied from [place] [id number]. Topic: General - Inquiry >> Jan 09, 2019 11:48 AM Richardo Priest, NT wrote: Reason for CRM: Patient calling in requesting an order be placed for labs as well as a chest x-ray for her COPD. Please advise and Call back is [phone].

## 2019-01-10 NOTE — Telephone Encounter (Signed)
Called and spoke with pt and CPX has been scheduled for the pt. Number was given to the patient to call and she is aware to call once she gets here

## 2019-01-10 NOTE — Telephone Encounter (Signed)
She really needs to come in for a physical. Set this up and she can get fasting labs and the CXR at the same time

## 2019-01-15 ENCOUNTER — Ambulatory Visit (INDEPENDENT_AMBULATORY_CARE_PROVIDER_SITE_OTHER): Payer: Medicare HMO

## 2019-01-15 ENCOUNTER — Ambulatory Visit (INDEPENDENT_AMBULATORY_CARE_PROVIDER_SITE_OTHER): Payer: Medicare HMO | Admitting: Family Medicine

## 2019-01-15 ENCOUNTER — Encounter: Payer: Self-pay | Admitting: Family Medicine

## 2019-01-15 ENCOUNTER — Other Ambulatory Visit: Payer: Self-pay

## 2019-01-15 VITALS — BP 120/87 | HR 89 | Temp 98.7°F | Ht 64.0 in | Wt 112.2 lb

## 2019-01-15 DIAGNOSIS — J449 Chronic obstructive pulmonary disease, unspecified: Secondary | ICD-10-CM | POA: Diagnosis not present

## 2019-01-15 DIAGNOSIS — E039 Hypothyroidism, unspecified: Secondary | ICD-10-CM

## 2019-01-15 DIAGNOSIS — J439 Emphysema, unspecified: Secondary | ICD-10-CM

## 2019-01-15 DIAGNOSIS — Z0000 Encounter for general adult medical examination without abnormal findings: Secondary | ICD-10-CM | POA: Diagnosis not present

## 2019-01-15 DIAGNOSIS — E559 Vitamin D deficiency, unspecified: Secondary | ICD-10-CM

## 2019-01-15 LAB — LIPID PANEL
Cholesterol: 186 mg/dL (ref 0–200)
HDL: 58.7 mg/dL (ref >39.00)
LDL Cholesterol: 109 mg/dL — ABNORMAL HIGH (ref 0–99)
NonHDL: 127.7
Total CHOL/HDL Ratio: 3
Triglycerides: 92 mg/dL (ref 0.0–149.0)
VLDL: 18.4 mg/dL (ref 0.0–40.0)

## 2019-01-15 LAB — CBC WITH DIFFERENTIAL/PLATELET
Basophils Absolute: 0.1 10*3/uL (ref 0.0–0.1)
Basophils Relative: 0.7 % (ref 0.0–3.0)
Eosinophils Absolute: 0.3 10*3/uL (ref 0.0–0.7)
Eosinophils Relative: 2.3 % (ref 0.0–5.0)
HCT: 41 % (ref 36.0–46.0)
Hemoglobin: 13.5 g/dL (ref 12.0–15.0)
Lymphocytes Relative: 29.3 % (ref 12.0–46.0)
Lymphs Abs: 3.4 10*3/uL (ref 0.7–4.0)
MCHC: 33 g/dL (ref 30.0–36.0)
MCV: 93.5 fl (ref 78.0–100.0)
Monocytes Absolute: 0.7 10*3/uL (ref 0.1–1.0)
Monocytes Relative: 6.2 % (ref 3.0–12.0)
Neutro Abs: 7.1 10*3/uL (ref 1.4–7.7)
Neutrophils Relative %: 61.5 % (ref 43.0–77.0)
Platelets: 276 10*3/uL (ref 150.0–400.0)
RBC: 4.38 Mil/uL (ref 3.87–5.11)
RDW: 13 % (ref 11.5–15.5)
WBC: 11.6 10*3/uL — ABNORMAL HIGH (ref 4.0–10.5)

## 2019-01-15 LAB — T3, FREE: T3, Free: 4.1 pg/mL (ref 2.3–4.2)

## 2019-01-15 LAB — HEPATIC FUNCTION PANEL
ALT: 9 U/L (ref 0–35)
AST: 14 U/L (ref 0–37)
Albumin: 4.4 g/dL (ref 3.5–5.2)
Alkaline Phosphatase: 69 U/L (ref 39–117)
Bilirubin, Direct: 0.2 mg/dL (ref 0.0–0.3)
Total Bilirubin: 0.9 mg/dL (ref 0.2–1.2)
Total Protein: 6.8 g/dL (ref 6.0–8.3)

## 2019-01-15 LAB — BASIC METABOLIC PANEL
BUN: 17 mg/dL (ref 6–23)
CO2: 28 mEq/L (ref 19–32)
Calcium: 9.5 mg/dL (ref 8.4–10.5)
Chloride: 104 mEq/L (ref 96–112)
Creatinine, Ser: 0.82 mg/dL (ref 0.40–1.20)
GFR: 68.64 mL/min (ref >60.00)
Glucose, Bld: 84 mg/dL (ref 70–99)
Potassium: 4.6 mEq/L (ref 3.5–5.1)
Sodium: 141 mEq/L (ref 135–145)

## 2019-01-15 LAB — POC URINALSYSI DIPSTICK (AUTOMATED)
Bilirubin, UA: NEGATIVE
Blood, UA: NEGATIVE
Glucose, UA: NEGATIVE
Ketones, UA: NEGATIVE
Leukocytes, UA: NEGATIVE
Nitrite, UA: NEGATIVE
Protein, UA: NEGATIVE
Spec Grav, UA: 1.01 (ref 1.010–1.025)
Urobilinogen, UA: 0.2 E.U./dL
pH, UA: 6 (ref 5.0–8.0)

## 2019-01-15 LAB — TSH: TSH: <0.01 u[IU]/mL — ABNORMAL LOW (ref 0.35–4.50)

## 2019-01-15 LAB — VITAMIN D 25 HYDROXY (VIT D DEFICIENCY, FRACTURES): VITD: 71.95 ng/mL (ref 30.00–100.00)

## 2019-01-15 LAB — T4, FREE: Free T4: 1.83 ng/dL — ABNORMAL HIGH (ref 0.60–1.60)

## 2019-01-15 NOTE — Progress Notes (Signed)
   Subjective:    Patient ID: Brooke Combs, female    DOB: [birth year], 73 y.o.   MRN: [id number]  HPI Here for a well exam. She feels fine.    Review of Systems  Constitutional: Negative.   HENT: Negative.   Eyes: Negative.   Respiratory: Negative.   Cardiovascular: Negative.   Gastrointestinal: Negative.   Genitourinary: Negative for decreased urine volume, difficulty urinating, dyspareunia, dysuria, enuresis, flank pain, frequency, hematuria, pelvic pain and urgency.  Musculoskeletal: Negative.   Skin: Negative.   Neurological: Negative.   Psychiatric/Behavioral: Negative.        Objective:   Physical Exam Constitutional:      General: She is not in acute distress.    Appearance: She is well-developed.  HENT:     Head: Normocephalic and atraumatic.     Right Ear: External ear normal.     Left Ear: External ear normal.     Nose: Nose normal.     Mouth/Throat:     Pharynx: No oropharyngeal exudate.  Eyes:     General: No scleral icterus.    Conjunctiva/sclera: Conjunctivae normal.     Pupils: Pupils are equal, round, and reactive to light.  Neck:     Musculoskeletal: Normal range of motion and neck supple.     Thyroid: No thyromegaly.     Vascular: No JVD.  Cardiovascular:     Rate and Rhythm: Normal rate and regular rhythm.     Heart sounds: Normal heart sounds. No murmur. No friction rub. No gallop.   Pulmonary:     Effort: Pulmonary effort is normal. No respiratory distress.     Breath sounds: Normal breath sounds. No wheezing or rales.  Chest:     Chest wall: No tenderness.  Abdominal:     General: Bowel sounds are normal. There is no distension.     Palpations: Abdomen is soft. There is no mass.     Tenderness: There is no abdominal tenderness. There is no guarding or rebound.  Musculoskeletal: Normal range of motion.        General: No tenderness.  Lymphadenopathy:     Cervical: No cervical adenopathy.  Skin:    General: Skin is warm and dry.     Findings: No erythema or rash.  Neurological:     Mental Status: She is alert and oriented to person, place, and time.     Cranial Nerves: No cranial nerve deficit.     Motor: No abnormal muscle tone.     Coordination: Coordination normal.     Deep Tendon Reflexes: Reflexes are normal and symmetric. Reflexes normal.  Psychiatric:        Behavior: Behavior normal.        Thought Content: Thought content normal.        Judgment: Judgment normal.           Assessment & Plan:  Well exam. We discussed diet and exercise. Get fasting labs. Once again she declined either a mammogram or a colonoscopy.  Brooke Penna, MD

## 2019-01-29 ENCOUNTER — Ambulatory Visit (INDEPENDENT_AMBULATORY_CARE_PROVIDER_SITE_OTHER): Payer: Medicare HMO | Admitting: Family Medicine

## 2019-01-29 ENCOUNTER — Encounter: Payer: Self-pay | Admitting: Family Medicine

## 2019-01-29 ENCOUNTER — Other Ambulatory Visit: Payer: Self-pay

## 2019-01-29 DIAGNOSIS — M26622 Arthralgia of left temporomandibular joint: Secondary | ICD-10-CM | POA: Diagnosis not present

## 2019-01-29 MED ORDER — METHYLPREDNISOLONE 4 MG PO TBPK: ORAL_TABLET | ORAL | 0 refills | Status: DC

## 2019-01-29 NOTE — Progress Notes (Signed)
Subjective:    Patient ID: Brooke Combs, female    DOB: [birth year], 73 y.o.   MRN: [id number]  HPI Virtual Visit via Video Note  I connected with the patient on 01/29/19 at 11:30 AM EDT by a video enabled telemedicine application and verified that I am speaking with the correct person using two identifiers.  Location patient: home Location provider:work or home office Persons participating in the virtual visit: patient, provider  I discussed the limitations of evaluation and management by telemedicine and the availability of in person appointments. The patient expressed understanding and agreed to proceed.   HPI: Here for 5 days of a sharp pain in the left jaw joint that prevents her from opening her mouth all the way. Chewing is painful so she is trying to stick with soft foods. Ibuprofen and Tylenol do not help. She has never had this before.    ROS: See pertinent positives and negatives per HPI.  Past Medical History:  Diagnosis Date  . Anxiety   . History of Graves' disease    had radioactive iodine ablation   . Hyperlipidemia   . Thyroid disease    hypothyroidism    History reviewed. No pertinent surgical history.  Family History  Problem Relation Age of Onset  . Lupus Sister   . Hypertension Sister      Current Outpatient Medications:  .  albuterol (VENTOLIN HFA) 108 (90 Base) MCG/ACT inhaler, INHALE 2 PUFFS EVERY 4 HOURS AS NEEDED FOR WHEEZING/SHORTNESS OF BREATH, Disp: 18 Inhaler, Rfl: 3 .  albuterol (VENTOLIN HFA) 108 (90 Base) MCG/ACT inhaler, INHALE 2 PUFFS EVERY 4 HOURS AS NEEDED FOR WHEEZING/SHORTNESS OF BREATH, Disp: 54 Inhaler, Rfl: 1 .  aspirin 81 MG tablet, Take 81 mg by mouth as needed. , Disp: , Rfl:  .  Cholecalciferol (VITAMIN D) 2000 units CAPS, Take by mouth daily., Disp: , Rfl:  .  cyclobenzaprine (FLEXERIL) 10 MG tablet, Take 1 tablet (10 mg total) by mouth 3 (three) times daily as needed for muscle spasms., Disp: 60 tablet, Rfl: 2 .   diazepam (VALIUM) 5 MG tablet, Take 1 tablet (5 mg total) by mouth every 8 (eight) hours as needed., Disp: 90 tablet, Rfl: 5 .  esomeprazole (NEXIUM) 20 MG capsule, Take 1 capsule (20 mg total) by mouth 2 (two) times daily before a meal. Over then counter, Disp: 1 capsule, Rfl: 0 .  FLOVENT HFA 44 MCG/ACT inhaler, TAKE 2 PUFFS BY MOUTH TWICE A DAY, Disp: 31.8 Inhaler, Rfl: 1 .  HYDROcodone-acetaminophen (NORCO) 5-325 MG tablet, Take 1 tablet by mouth every 4 (four) hours as needed for moderate pain., Disp: 30 tablet, Rfl: 0 .  simvastatin (ZOCOR) 40 MG tablet, TAKE 1 TABLET BY MOUTH EVERYDAY AT BEDTIME, Disp: 90 tablet, Rfl: 0 .  SYNTHROID 100 MCG tablet, TAKE 1 TABLET BY MOUTH EVERY DAY BEFORE BREAKFAST, Disp: 90 tablet, Rfl: 3 .  traMADol (ULTRAM) 50 MG tablet, Take 2 tablets (100 mg total) by mouth every 6 (six) hours as needed for moderate pain., Disp: 60 tablet, Rfl: 0 .  methylPREDNISolone (MEDROL DOSEPAK) 4 MG TBPK tablet, As directed, Disp: 21 tablet, Rfl: 0  EXAM:  VITALS per patient if applicable:  GENERAL: alert, oriented, appears well and in no acute distress  HEENT: atraumatic, conjunttiva clear, no obvious abnormalities on inspection of external nose and ears. She clearly points to the left TMJ as the source of her pain.  NECK: normal movements of the head and neck  LUNGS:  on inspection no signs of respiratory distress, breathing rate appears normal, no obvious gross SOB, gasping or wheezing  CV: no obvious cyanosis  MS: moves all visible extremities without noticeable abnormality  PSYCH/NEURO: pleasant and cooperative, no obvious depression or anxiety, speech and thought processing grossly intact  ASSESSMENT AND PLAN: TMJ pain. She is to talk as little as possible for the next week to rest the jaw. Given a Medrol dose pack. Recheck prn.  Alysia Penna, MD  Discussed the following assessment and plan:  No diagnosis found.     I discussed the assessment and treatment  plan with the patient. The patient was provided an opportunity to ask questions and all were answered. The patient agreed with the plan and demonstrated an understanding of the instructions.   The patient was advised to call back or seek an in-person evaluation if the symptoms worsen or if the condition fails to improve as anticipated.     Review of Systems     Objective:   Physical Exam        Assessment & Plan:

## 2019-02-11 ENCOUNTER — Telehealth: Payer: Self-pay | Admitting: Family Medicine

## 2019-02-11 NOTE — Progress Notes (Signed)
Virtual Visit via Video Note  I connected with@ on 02/12/19 at 11:00 AM EDT by a video enabled telemedicine application and verified that I am speaking with the correct person using two identifiers. Location patient: home in car  Location provider:work  office Persons participating in the virtual visit: patient, provider  [place] national recommendations  regarding COVID 19 pandemic   video visit is advised over in office visit for this patient.  Patient aware  of the limitations of evaluation and management by telemedicine and  availability of in person appointments. and agreed to proceed.   HPI: Brooke Combs presents for video visit PCP NA  Was seen 2 weeks ago for jaw pain but continueing    No better  No injury   Has full dentures  for years  But admits don't  fit right but never had problem before . No swallowing issues but has lost weight cause  Pain when puts dentures in and tries to chew bit down  On anything.     ROS: See pertinent positives and negatives per HPI. No fever  Hx of same dislocation  Neur sx  On valium 5 q8 hours for  Psych reasons after history  Of bad events I n past.     Past Medical History:  Diagnosis Date  . Anxiety   . History of Graves' disease    had radioactive iodine ablation   . Hyperlipidemia   . Thyroid disease    hypothyroidism    No past surgical history on file.  Family History  Problem Relation Age of Onset  . Lupus Sister   . Hypertension Sister     Social History   Tobacco Use  . Smoking status: Current Every Day Smoker    Years: 30.00    Types: Cigarettes  . Smokeless tobacco: Never Used  . Tobacco comment: 1/2 pack per day  Substance Use Topics  . Alcohol use: No    Alcohol/week: 0.0 standard drinks  . Drug use: No      Current Outpatient Medications:  .  albuterol (VENTOLIN HFA) 108 (90 Base) MCG/ACT inhaler, INHALE 2 PUFFS EVERY 4 HOURS AS NEEDED FOR WHEEZING/SHORTNESS OF BREATH, Disp: 18 Inhaler, Rfl: 3 .   albuterol (VENTOLIN HFA) 108 (90 Base) MCG/ACT inhaler, INHALE 2 PUFFS EVERY 4 HOURS AS NEEDED FOR WHEEZING/SHORTNESS OF BREATH, Disp: 54 Inhaler, Rfl: 1 .  aspirin 81 MG tablet, Take 81 mg by mouth as needed. , Disp: , Rfl:  .  Cholecalciferol (VITAMIN D) 2000 units CAPS, Take by mouth daily., Disp: , Rfl:  .  cyclobenzaprine (FLEXERIL) 10 MG tablet, Take 1 tablet (10 mg total) by mouth 3 (three) times daily as needed for muscle spasms., Disp: 60 tablet, Rfl: 2 .  diazepam (VALIUM) 5 MG tablet, Take 1 tablet (5 mg total) by mouth every 8 (eight) hours as needed., Disp: 90 tablet, Rfl: 5 .  esomeprazole (NEXIUM) 20 MG capsule, Take 1 capsule (20 mg total) by mouth 2 (two) times daily before a meal. Over then counter, Disp: 1 capsule, Rfl: 0 .  FLOVENT HFA 44 MCG/ACT inhaler, TAKE 2 PUFFS BY MOUTH TWICE A DAY, Disp: 31.8 Inhaler, Rfl: 1 .  HYDROcodone-acetaminophen (NORCO) 5-325 MG tablet, Take 1 tablet by mouth every 4 (four) hours as needed for moderate pain., Disp: 30 tablet, Rfl: 0 .  methylPREDNISolone (MEDROL DOSEPAK) 4 MG TBPK tablet, As directed, Disp: 21 tablet, Rfl: 0 .  simvastatin (ZOCOR) 40 MG tablet, TAKE 1 TABLET BY  MOUTH EVERYDAY AT BEDTIME, Disp: 90 tablet, Rfl: 0 .  SYNTHROID 100 MCG tablet, TAKE 1 TABLET BY MOUTH EVERY DAY BEFORE BREAKFAST, Disp: 90 tablet, Rfl: 3 .  traMADol (ULTRAM) 50 MG tablet, Take 2 tablets (100 mg total) by mouth every 6 (six) hours as needed for moderate pain., Disp: 60 tablet, Rfl: 0 .  baclofen (LIORESAL) 10 MG tablet, Take 1 tablet (10 mg total) by mouth 3 (three) times daily. As needed for jaw/face pain, Disp: 40 each, Rfl: 0  EXAM: BP Readings from Last 3 Encounters:  01/15/19 120/87  04/30/18 120/62  04/24/18 118/64    VITALS per patient if applicable:  GENERAL: alert, oriented, appears well and in no acute distress  HEENT: atraumatic, conjunttiva clear, no obvious abnormalities on inspection of external nose and ears has full dntures no  swelling and speech seems nl    NECK: normal movements of the head and neck  LUNGS: on inspection no signs of respiratory distress, breathing rate appears normal, no obvious gross SOB, gasping or wheezing CV: no obvious cyanosis  PSYCH/NEURO: pleasant and cooperative, no obvious depression or anxiety, speech and thought processing grossly intact   ASSESSMENT AND PLAN:  Discussed the following assessment and plan:    ICD-10-CM   1. Arthralgia of left temporomandibular joint  M26.622     unresponsive to steroids and joint rest  So far  Suspect    Trial baclofen    Consider getting  Dentures and   Fit   Reassessed.   Continue soft  foods   Sent message touch base with dr Sarajane Jews in a week about how doing for any next steps.   Counseled.   Expectant management and discussion of plan and treatment with opportunity to ask questions and all were answered. The patient agreed with the plan and demonstrated an understanding of the instructions.   Advised to call back or seek an in-person evaluation if worsening  or having  further concerns . In interim   Shanon Ace, MD

## 2019-02-11 NOTE — Telephone Encounter (Signed)
Please advise 

## 2019-02-11 NOTE — Telephone Encounter (Signed)
Dr Sarajane Jews is out of office until next week. Option : wait for Dr Sarajane Jews to come back next week for advice   Arrange a video or  In person visit  As available  Tomorrow   In addition I suggest she consider seeing her dentist  Sometimes a  Bite guard is helpful

## 2019-02-11 NOTE — Telephone Encounter (Signed)
Appointment scheduled with Dr. Regis Bill tomorrow.

## 2019-02-11 NOTE — Telephone Encounter (Signed)
Pt calling.  States whatever she was given for TMJ didn't work.  Pt states it has now been 2.5 weeks since she could put her teeth in to chew.  Pt states that Dr. Sarajane Jews told her if she was no better to just call in and he would prescribe something different. Pt uses  [id number] - [place], [place] - [address] [phone] (Phone) [phone] (Fax)

## 2019-02-12 ENCOUNTER — Encounter: Payer: Self-pay | Admitting: Internal Medicine

## 2019-02-12 ENCOUNTER — Other Ambulatory Visit: Payer: Self-pay

## 2019-02-12 ENCOUNTER — Ambulatory Visit (INDEPENDENT_AMBULATORY_CARE_PROVIDER_SITE_OTHER): Payer: Medicare HMO | Admitting: Internal Medicine

## 2019-02-12 DIAGNOSIS — M26622 Arthralgia of left temporomandibular joint: Secondary | ICD-10-CM | POA: Diagnosis not present

## 2019-02-12 MED ORDER — BACLOFEN 10 MG PO TABS: 10.0000 mg | ORAL_TABLET | Freq: Three times a day (TID) | ORAL | 0 refills | Status: DC

## 2019-02-19 ENCOUNTER — Telehealth: Payer: Self-pay | Admitting: Family Medicine

## 2019-02-19 MED ORDER — SIMVASTATIN 40 MG PO TABS: ORAL_TABLET | ORAL | 0 refills | Status: DC

## 2019-02-19 MED ORDER — FLOVENT HFA 44 MCG/ACT IN AERO: INHALATION_SPRAY | RESPIRATORY_TRACT | 1 refills | Status: DC

## 2019-02-19 MED ORDER — ALBUTEROL SULFATE HFA 108 (90 BASE) MCG/ACT IN AERS: INHALATION_SPRAY | RESPIRATORY_TRACT | 1 refills | Status: DC

## 2019-02-19 NOTE — Telephone Encounter (Signed)
Diazepam 10/09/2018   Last OV 02/11/2019  Albuterol, Flovent, and simvistatin have been sent in.

## 2019-02-19 NOTE — Telephone Encounter (Addendum)
Pt request refill  simvastatin (ZOCOR) 40 MG tablet albuterol (VENTOLIN HFA) 108 (90 Base) MCG/ACT inhaler FLOVENT HFA 44 MCG/ACT inhaler diazepam (VALIUM) 5 MG tablet  Pt states she usually gets a year supply after her yearly visit, but she has no refills.  [id number] [place], [place] - [id number] [phone] (Phone) [phone] (Fax)   Pt also wants the dr to put refills so she does not have to call every month on her   SYNTHROID 100 MCG tablet

## 2019-02-20 ENCOUNTER — Telehealth (INDEPENDENT_AMBULATORY_CARE_PROVIDER_SITE_OTHER): Payer: Medicare HMO | Admitting: Family Medicine

## 2019-02-20 ENCOUNTER — Other Ambulatory Visit: Payer: Self-pay

## 2019-02-20 ENCOUNTER — Encounter: Payer: Self-pay | Admitting: Family Medicine

## 2019-02-20 DIAGNOSIS — M26659 Arthropathy of unspecified temporomandibular joint: Secondary | ICD-10-CM

## 2019-02-20 DIAGNOSIS — M26609 Unspecified temporomandibular joint disorder, unspecified side: Secondary | ICD-10-CM | POA: Diagnosis not present

## 2019-02-20 MED ORDER — HYDROCODONE-ACETAMINOPHEN 5-325 MG PO TABS: 1.0000 | ORAL_TABLET | ORAL | 0 refills | Status: DC | PRN

## 2019-02-20 NOTE — Telephone Encounter (Signed)
This makes NO sense. I gave her a year supply of Synthroid on 01-07-19, and her Diazepam is not due until 04-11-19.

## 2019-02-20 NOTE — Progress Notes (Signed)
Virtual Visit via Video Note  I connected with the patient on 02/20/19 at  4:00 PM EDT by a video enabled telemedicine application and verified that I am speaking with the correct person using two identifiers.  Location patient: home Location provider:work or home office Persons participating in the virtual visit: patient, provider  I discussed the limitations of evaluation and management by telemedicine and the availability of in person appointments. The patient expressed understanding and agreed to proceed.   HPI: Here for one month of left sided jaw pain. She has extreme pain when she chews anything other than soft foods and it is difficult to open her mouth very far. I gave her a Medrol dose pack, but this did not help. She then saw Dr. Regis Bill who gave her some Baclofen, but this did not help either. Warm compresses do not help. She cannot tolerate any NSAIDS because they give her rashes. She admits her dentures do not fit well, and she has not seen a dentist for at least 12 years.    ROS: See pertinent positives and negatives per HPI.  Past Medical History:  Diagnosis Date  . Anxiety   . History of Graves' disease    had radioactive iodine ablation   . Hyperlipidemia   . Thyroid disease    hypothyroidism    No past surgical history on file.  Family History  Problem Relation Age of Onset  . Lupus Sister   . Hypertension Sister      Current Outpatient Medications:  .  albuterol (VENTOLIN HFA) 108 (90 Base) MCG/ACT inhaler, INHALE 2 PUFFS EVERY 4 HOURS AS NEEDED FOR WHEEZING/SHORTNESS OF BREATH, Disp: 18 Inhaler, Rfl: 3 .  albuterol (VENTOLIN HFA) 108 (90 Base) MCG/ACT inhaler, INHALE 2 PUFFS EVERY 4 HOURS AS NEEDED FOR WHEEZING/SHORTNESS OF BREATH, Disp: 54 g, Rfl: 1 .  aspirin 81 MG tablet, Take 81 mg by mouth as needed. , Disp: , Rfl:  .  baclofen (LIORESAL) 10 MG tablet, Take 1 tablet (10 mg total) by mouth 3 (three) times daily. As needed for jaw/face pain, Disp: 40  each, Rfl: 0 .  Cholecalciferol (VITAMIN D) 2000 units CAPS, Take by mouth daily., Disp: , Rfl:  .  cyclobenzaprine (FLEXERIL) 10 MG tablet, Take 1 tablet (10 mg total) by mouth 3 (three) times daily as needed for muscle spasms., Disp: 60 tablet, Rfl: 2 .  diazepam (VALIUM) 5 MG tablet, Take 1 tablet (5 mg total) by mouth every 8 (eight) hours as needed., Disp: 90 tablet, Rfl: 5 .  esomeprazole (NEXIUM) 20 MG capsule, Take 1 capsule (20 mg total) by mouth 2 (two) times daily before a meal. Over then counter, Disp: 1 capsule, Rfl: 0 .  fluticasone (FLOVENT HFA) 44 MCG/ACT inhaler, TAKE 2 PUFFS BY MOUTH TWICE A DAY, Disp: 31.8 Inhaler, Rfl: 1 .  HYDROcodone-acetaminophen (NORCO) 5-325 MG tablet, Take 1 tablet by mouth every 4 (four) hours as needed for moderate pain., Disp: 30 tablet, Rfl: 0 .  methylPREDNISolone (MEDROL DOSEPAK) 4 MG TBPK tablet, As directed, Disp: 21 tablet, Rfl: 0 .  simvastatin (ZOCOR) 40 MG tablet, TAKE 1 TABLET BY MOUTH EVERYDAY AT BEDTIME, Disp: 90 tablet, Rfl: 0 .  SYNTHROID 100 MCG tablet, TAKE 1 TABLET BY MOUTH EVERY DAY BEFORE BREAKFAST, Disp: 90 tablet, Rfl: 3 .  traMADol (ULTRAM) 50 MG tablet, Take 2 tablets (100 mg total) by mouth every 6 (six) hours as needed for moderate pain., Disp: 60 tablet, Rfl: 0  EXAM:  VITALS  per patient if applicable:  GENERAL: alert, oriented, appears well and in no acute distress  HEENT: atraumatic, conjunttiva clear, no obvious abnormalities on inspection of external nose and ears  NECK: normal movements of the head and neck  LUNGS: on inspection no signs of respiratory distress, breathing rate appears normal, no obvious gross SOB, gasping or wheezing  CV: no obvious cyanosis  MS: moves all visible extremities without noticeable abnormality  PSYCH/NEURO: pleasant and cooperative, no obvious depression or anxiety, speech and thought processing grossly intact  ASSESSMENT AND PLAN: TMJ. She will try some Norco for pain relief to  allow her to eat food. She needs to see a dentist ASAP, and she agreed. She will contact her daughter's dental office tomorrow.  Gershon CraneStephen Gizella Belleville, MD  Discussed the following assessment and plan:  No diagnosis found.     I discussed the assessment and treatment plan with the patient. The patient was provided an opportunity to ask questions and all were answered. The patient agreed with the plan and demonstrated an understanding of the instructions.   The patient was advised to call back or seek an in-person evaluation if the symptoms worsen or if the condition fails to improve as anticipated.

## 2019-02-20 NOTE — Telephone Encounter (Signed)
Patient has a virtual appointment this afternoon. Will send to Dr. Sarajane Jews as Juluis Rainier.

## 2019-03-23 DIAGNOSIS — R69 Illness, unspecified: Secondary | ICD-10-CM | POA: Diagnosis not present

## 2019-03-26 ENCOUNTER — Other Ambulatory Visit: Payer: Self-pay | Admitting: Family Medicine

## 2019-03-27 NOTE — Telephone Encounter (Signed)
Last filled 10/09/2018 Last OV 02/12/2019  Ok to fill?

## 2019-04-24 ENCOUNTER — Encounter: Payer: Self-pay | Admitting: Family Medicine

## 2019-04-24 ENCOUNTER — Ambulatory Visit (INDEPENDENT_AMBULATORY_CARE_PROVIDER_SITE_OTHER): Payer: Medicare HMO

## 2019-04-24 ENCOUNTER — Other Ambulatory Visit: Payer: Self-pay

## 2019-04-24 ENCOUNTER — Ambulatory Visit (INDEPENDENT_AMBULATORY_CARE_PROVIDER_SITE_OTHER): Payer: Medicare HMO | Admitting: Family Medicine

## 2019-04-24 VITALS — BP 120/72 | HR 86 | Temp 98.0°F | Wt 112.2 lb

## 2019-04-24 DIAGNOSIS — R6884 Jaw pain: Secondary | ICD-10-CM

## 2019-04-24 MED ORDER — OXYCODONE-ACETAMINOPHEN 10-325 MG PO TABS: 1.0000 | ORAL_TABLET | Freq: Four times a day (QID) | ORAL | 0 refills | Status: AC | PRN

## 2019-04-24 NOTE — Progress Notes (Signed)
   Subjective:    Patient ID: Brooke Combs, female    DOB: 07-13-1946, 73 y.o.   MRN: [id number]  HPI Here for ongoing left jaw pain. This started about 3 months ago, and it is getting worse. She can no longer wear her teeth in, and eating food is extremely painful. She mostly has ice cream or milkshakes now. She tried some Norco for this, but it di not help. She saw her dentist, but he said she would need to see a specialist for this. No Xrays were taken.    Review of Systems  Constitutional: Negative.   HENT: Positive for dental problem.   Respiratory: Negative.   Cardiovascular: Negative.   Neurological: Negative.        Objective:   Physical Exam Constitutional:      Appearance: Normal appearance.  HENT:     Mouth/Throat:     Comments: She is very tender over the left TMJ, there is crepitus, and ROM is limited by pain. She is also very tender over the middle portion of the left mandible, no swelling or masses are felt.  Cardiovascular:     Rate and Rhythm: Normal rate and regular rhythm.     Pulses: Normal pulses.     Heart sounds: Normal heart sounds.  Pulmonary:     Effort: Pulmonary effort is normal.     Breath sounds: Normal breath sounds.  Lymphadenopathy:     Cervical: No cervical adenopathy.  Neurological:     Mental Status: She is alert.           Assessment & Plan:  She has TMJ pain but she also has pain in a different portion of the left mandible. We will get Xrays of the mandible today. She will try Percocet for pain. She plans to move to [place] on 05-30-19 to live near her daughter, so she wants to wait to see a specialist after she moves. In the meantime I suggested she drink Ensure or Boost to maintain her nutrition.  Alysia Penna, MD

## 2019-04-26 ENCOUNTER — Telehealth: Payer: Self-pay | Admitting: Family Medicine

## 2019-04-26 NOTE — Telephone Encounter (Signed)
Patient inquiring about imaging results, please advise

## 2019-04-26 NOTE — Telephone Encounter (Signed)
Patient notified per Dr. Sarajane Jews Normal jaw bone. Patient wants to know what to do from here?

## 2019-04-26 NOTE — Telephone Encounter (Signed)
As we discussed at her last OV, after she moves she needs to find a TMJ specialist that I can refer her to

## 2019-04-29 NOTE — Telephone Encounter (Signed)
Noted  

## 2019-04-29 NOTE — Telephone Encounter (Signed)
Please advise 

## 2019-04-29 NOTE — Telephone Encounter (Signed)
Spoke with patient. Informed her of Dr. Barbie Banner message. Patient verbalized understanding. Patient wants to know if she can get a year supply of  Her meds before she leaves next month

## 2019-04-29 NOTE — Telephone Encounter (Signed)
No the pharmacy can only give her a 90 days supply to take with her

## 2019-05-13 ENCOUNTER — Other Ambulatory Visit: Payer: Self-pay | Admitting: Family Medicine

## 2019-05-20 ENCOUNTER — Other Ambulatory Visit: Payer: Self-pay | Admitting: Family Medicine

## 2019-07-29 ENCOUNTER — Telehealth: Payer: Self-pay | Admitting: Family Medicine

## 2019-07-29 MED ORDER — SIMVASTATIN 40 MG PO TABS: ORAL_TABLET | ORAL | 0 refills | Status: DC

## 2019-07-29 MED ORDER — SYNTHROID 100 MCG PO TABS: ORAL_TABLET | ORAL | 3 refills | Status: DC

## 2019-07-29 MED ORDER — ALBUTEROL SULFATE HFA 108 (90 BASE) MCG/ACT IN AERS: INHALATION_SPRAY | RESPIRATORY_TRACT | 1 refills | Status: DC

## 2019-07-29 NOTE — Telephone Encounter (Signed)
Rx sent in. Patient is aware.  

## 2019-07-29 NOTE — Telephone Encounter (Signed)
  Medication Refill - Medication: SYNTHROID 100 MCG tablet   simvastatin (ZOCOR) 40 MG tablet    albuterol (VENTOLIN HFA) 108 (90 Base) MCG/ACT inhaler  albuterol (VENTOLIN HFA) 108 (90 Base) MCG/ACT inhaler  Has the patient contacted their pharmacy? No. (Agent: If no, request that the patient contact the pharmacy for the refill.) (Agent: If yes, when and what did the pharmacy advise?)  Preferred Pharmacy (with phone number or street name):  [facility] [address], [place] - [address] Phone:  [phone]  Fax:  [phone]       Agent: Please be advised that RX refills may take up to 3 business days. We ask that you follow-up with your pharmacy.

## 2019-09-05 ENCOUNTER — Other Ambulatory Visit: Payer: Self-pay | Admitting: Family Medicine

## 2019-09-06 ENCOUNTER — Other Ambulatory Visit: Payer: Self-pay | Admitting: Family Medicine

## 2019-10-28 ENCOUNTER — Telehealth: Payer: Self-pay | Admitting: Family Medicine

## 2019-10-28 MED ORDER — ALBUTEROL SULFATE HFA 108 (90 BASE) MCG/ACT IN AERS: INHALATION_SPRAY | RESPIRATORY_TRACT | 1 refills | Status: DC

## 2019-10-28 NOTE — Telephone Encounter (Signed)
Rx sent in. Left a detailed message on verified voice mail.   

## 2019-10-28 NOTE — Addendum Note (Signed)
Addended by: Solon Augusta on: 10/28/2019 02:21 PM   Modules accepted: Orders

## 2019-10-28 NOTE — Telephone Encounter (Signed)
albuterol (VENTOLIN HFA) 108 (90 Base) MCG/ACT inhaler  [facility] [address], [place] - [address] [facility] [address] [facility] [address] [address] Phone:  [phone]  Fax:  [phone]

## 2019-11-19 ENCOUNTER — Other Ambulatory Visit: Payer: Self-pay

## 2019-11-20 ENCOUNTER — Ambulatory Visit (INDEPENDENT_AMBULATORY_CARE_PROVIDER_SITE_OTHER): Payer: Medicare (Managed Care) | Admitting: Family Medicine

## 2019-11-20 ENCOUNTER — Encounter: Payer: Self-pay | Admitting: Family Medicine

## 2019-11-20 VITALS — BP 140/70 | HR 98 | Temp 97.7°F | Wt 112.6 lb

## 2019-11-20 DIAGNOSIS — J439 Emphysema, unspecified: Secondary | ICD-10-CM

## 2019-11-20 DIAGNOSIS — E782 Mixed hyperlipidemia: Secondary | ICD-10-CM

## 2019-11-20 DIAGNOSIS — G4700 Insomnia, unspecified: Secondary | ICD-10-CM | POA: Diagnosis not present

## 2019-11-20 DIAGNOSIS — E039 Hypothyroidism, unspecified: Secondary | ICD-10-CM | POA: Diagnosis not present

## 2019-11-20 DIAGNOSIS — E559 Vitamin D deficiency, unspecified: Secondary | ICD-10-CM | POA: Diagnosis not present

## 2019-11-20 DIAGNOSIS — K219 Gastro-esophageal reflux disease without esophagitis: Secondary | ICD-10-CM

## 2019-11-20 DIAGNOSIS — F411 Generalized anxiety disorder: Secondary | ICD-10-CM

## 2019-11-20 LAB — CBC WITH DIFFERENTIAL/PLATELET
Basophils Absolute: 0.1 10*3/uL (ref 0.0–0.1)
Basophils Relative: 1.1 % (ref 0.0–3.0)
Eosinophils Absolute: 0.3 10*3/uL (ref 0.0–0.7)
Eosinophils Relative: 4.3 % (ref 0.0–5.0)
HCT: 40.2 % (ref 36.0–46.0)
Hemoglobin: 13.4 g/dL (ref 12.0–15.0)
Lymphocytes Relative: 45.9 % (ref 12.0–46.0)
Lymphs Abs: 3 10*3/uL (ref 0.7–4.0)
MCHC: 33.4 g/dL (ref 30.0–36.0)
MCV: 93.3 fl (ref 78.0–100.0)
Monocytes Absolute: 0.4 10*3/uL (ref 0.1–1.0)
Monocytes Relative: 7 % (ref 3.0–12.0)
Neutro Abs: 2.7 10*3/uL (ref 1.4–7.7)
Neutrophils Relative %: 41.7 % — ABNORMAL LOW (ref 43.0–77.0)
Platelets: 257 10*3/uL (ref 150.0–400.0)
RBC: 4.31 Mil/uL (ref 3.87–5.11)
RDW: 13.2 % (ref 11.5–15.5)
WBC: 6.4 10*3/uL (ref 4.0–10.5)

## 2019-11-20 LAB — BASIC METABOLIC PANEL
BUN: 18 mg/dL (ref 6–23)
CO2: 33 mEq/L — ABNORMAL HIGH (ref 19–32)
Calcium: 10.4 mg/dL (ref 8.4–10.5)
Chloride: 105 mEq/L (ref 96–112)
Creatinine, Ser: 0.78 mg/dL (ref 0.40–1.20)
GFR: 72.54 mL/min (ref >60.00)
Glucose, Bld: 97 mg/dL (ref 70–99)
Potassium: 4.8 mEq/L (ref 3.5–5.1)
Sodium: 144 mEq/L (ref 135–145)

## 2019-11-20 LAB — URINALYSIS, ROUTINE W REFLEX MICROSCOPIC
Bilirubin Urine: NEGATIVE
Hgb urine dipstick: NEGATIVE
Ketones, ur: NEGATIVE
Nitrite: NEGATIVE
RBC / HPF: NONE SEEN (ref 0–?)
Specific Gravity, Urine: 1.01 (ref 1.000–1.030)
Total Protein, Urine: NEGATIVE
Urine Glucose: NEGATIVE
Urobilinogen, UA: 0.2 (ref 0.0–1.0)
pH: 7 (ref 5.0–8.0)

## 2019-11-20 LAB — TSH: TSH: <0.01 u[IU]/mL — ABNORMAL LOW (ref 0.35–4.50)

## 2019-11-20 LAB — LIPID PANEL
Cholesterol: 166 mg/dL (ref 0–200)
HDL: 56.3 mg/dL (ref >39.00)
LDL Cholesterol: 89 mg/dL (ref 0–99)
NonHDL: 109.5
Total CHOL/HDL Ratio: 3
Triglycerides: 103 mg/dL (ref 0.0–149.0)
VLDL: 20.6 mg/dL (ref 0.0–40.0)

## 2019-11-20 LAB — T3, FREE: T3, Free: 4 pg/mL (ref 2.3–4.2)

## 2019-11-20 LAB — HEPATIC FUNCTION PANEL
ALT: 7 U/L (ref 0–35)
AST: 13 U/L (ref 0–37)
Albumin: 4.5 g/dL (ref 3.5–5.2)
Alkaline Phosphatase: 59 U/L (ref 39–117)
Bilirubin, Direct: 0.1 mg/dL (ref 0.0–0.3)
Total Bilirubin: 0.9 mg/dL (ref 0.2–1.2)
Total Protein: 6.7 g/dL (ref 6.0–8.3)

## 2019-11-20 LAB — VITAMIN D 25 HYDROXY (VIT D DEFICIENCY, FRACTURES): VITD: 104.06 ng/mL (ref 30.00–100.00)

## 2019-11-20 LAB — T4, FREE: Free T4: 1.85 ng/dL — ABNORMAL HIGH (ref 0.60–1.60)

## 2019-11-20 MED ORDER — ALBUTEROL SULFATE HFA 108 (90 BASE) MCG/ACT IN AERS: INHALATION_SPRAY | RESPIRATORY_TRACT | 5 refills | Status: DC

## 2019-11-20 MED ORDER — SIMVASTATIN 40 MG PO TABS: ORAL_TABLET | ORAL | 3 refills | Status: DC

## 2019-11-20 MED ORDER — DIAZEPAM 5 MG PO TABS: 5.0000 mg | ORAL_TABLET | Freq: Three times a day (TID) | ORAL | 5 refills | Status: DC | PRN

## 2019-11-20 MED ORDER — SYNTHROID 100 MCG PO TABS: ORAL_TABLET | ORAL | 3 refills | Status: DC

## 2019-11-20 MED ORDER — FLOVENT HFA 44 MCG/ACT IN AERO: 2.0000 | INHALATION_SPRAY | Freq: Two times a day (BID) | RESPIRATORY_TRACT | 5 refills | Status: DC

## 2019-11-20 NOTE — Progress Notes (Signed)
   Subjective:    Patient ID: Brooke Combs, female    DOB: [birth year], 74 y.o.   MRN: [id number]  HPI Here for labs and a medication check. She moved to [place] last November, but she still plans to drive to [place] several times a year to visit friends. She is fasting today for lab work. She feels well. She plans to get the Covid vaccine next week. Last year she had been struggling with chronic mouth and jaw pain which stemmed from ill fitting dentures and 4 poorly placed implants. She recently had the implants removed and she had a new set of teeth made, and now they fit much better. The pain is gone and she can eat a normal diet.   Review of Systems  Constitutional: Negative.   Respiratory: Negative.   Cardiovascular: Negative.   Endocrine: Negative.        Objective:   Physical Exam Constitutional:      Appearance: Normal appearance.  Cardiovascular:     Rate and Rhythm: Normal rate and regular rhythm.     Pulses: Normal pulses.     Heart sounds: Normal heart sounds.  Pulmonary:     Effort: Pulmonary effort is normal.     Breath sounds: Normal breath sounds.  Lymphadenopathy:     Cervical: No cervical adenopathy.  Neurological:     Mental Status: She is alert.           Assessment & Plan:  Her anxiety is stable. Her dental problems have been addressed. We will get fasting labs today to check lipids, thyroid levels, etc.  Gershon Crane, MD

## 2020-01-14 ENCOUNTER — Telehealth: Payer: Self-pay | Admitting: *Deleted

## 2020-01-14 MED ORDER — ZOLPIDEM TARTRATE 10 MG PO TABS
10.0000 mg | ORAL_TABLET | Freq: Every evening | ORAL | 5 refills | Status: DC | PRN
Start: 2020-01-14 — End: 2020-01-16

## 2020-01-14 NOTE — Addendum Note (Signed)
Addended by: Gershon Crane A on: 01/14/2020 09:55 PM   Modules accepted: Orders

## 2020-01-14 NOTE — Telephone Encounter (Signed)
Done

## 2020-01-14 NOTE — Telephone Encounter (Signed)
Patient called office and stated that she had a visit in May and discussed not being able to sleep. She stated that she has taking Ambien before and would like to know if this can be sent to the pharmacy.

## 2020-01-15 ENCOUNTER — Telehealth: Payer: Self-pay | Admitting: Family Medicine

## 2020-01-15 ENCOUNTER — Telehealth: Payer: Self-pay

## 2020-01-15 NOTE — Telephone Encounter (Signed)
Brooke Combs pt returning your call

## 2020-01-15 NOTE — Telephone Encounter (Signed)
This has been taking care of. Please see phone note from 01/15/2019.

## 2020-01-15 NOTE — Telephone Encounter (Signed)
Pt call and want her refill on zolpidem (AMBIEN) 10 MG tablet sent to [facility] at [address], [place] ,[place],[address]  Store # [phone].

## 2020-01-15 NOTE — Telephone Encounter (Signed)
Please advise. Pt would like to start using this pharmacy. Ok to re-send medication to requested pharmacy?

## 2020-01-15 NOTE — Telephone Encounter (Signed)
mija effertz (Key: [facility]) - [id number] Zolpidem Tartrate 10MG  tablets     Status: PA Response - Approved Created: June 30th, 2021 July 2nd, 2021  Sent: June 30th, 2021  Open  Archive    Spoke to pt ans she stated that she called the insurance company as well as our department this morning to update address. No further action needed. Pt informed of approval

## 2020-01-15 NOTE — Telephone Encounter (Addendum)
Tried to start a PA for pt Zolpidem Tartrate but the address is not matching. Calle pt to verify but no answer. Left message for pt to return call.   Started PA with Address in our records.    Car[facility] [place]: [facility] - PA Case ID: [id number] - Rx #: [id number] Need help? Call us at [phone] Status Sent to [facility] Zolpidem Tartrate 10MG  tablets Form [employer] Medicare Electronic PA Form [phone] NCPDP)

## 2020-01-16 MED ORDER — ZOLPIDEM TARTRATE 10 MG PO TABS
10.0000 mg | ORAL_TABLET | Freq: Every evening | ORAL | 5 refills | Status: DC | PRN
Start: 2020-01-16 — End: 2021-09-02

## 2020-01-16 NOTE — Telephone Encounter (Signed)
Spoke with the patient. She is aware her medication has been sent in.  

## 2020-01-16 NOTE — Telephone Encounter (Signed)
Done

## 2020-04-13 ENCOUNTER — Ambulatory Visit: Payer: Medicare (Managed Care) | Admitting: Family Medicine

## 2020-04-14 ENCOUNTER — Other Ambulatory Visit: Payer: Self-pay

## 2020-04-15 ENCOUNTER — Ambulatory Visit (INDEPENDENT_AMBULATORY_CARE_PROVIDER_SITE_OTHER): Payer: Medicare (Managed Care) | Admitting: Family Medicine

## 2020-04-15 ENCOUNTER — Encounter: Payer: Self-pay | Admitting: Family Medicine

## 2020-04-15 ENCOUNTER — Ambulatory Visit: Payer: Medicare (Managed Care) | Admitting: Family Medicine

## 2020-04-15 VITALS — BP 144/80 | HR 81 | Temp 99.0°F | Ht 63.25 in | Wt 103.0 lb

## 2020-04-15 DIAGNOSIS — M545 Low back pain, unspecified: Secondary | ICD-10-CM

## 2020-04-15 MED ORDER — HYDROCODONE-ACETAMINOPHEN 10-325 MG PO TABS: 1.0000 | ORAL_TABLET | ORAL | 0 refills | Status: AC | PRN

## 2020-04-15 NOTE — Progress Notes (Signed)
   Subjective:    Patient ID: [name], female    DOB: Aug 19, 1945, 74 y.o.   MRN: [id number]  HPI Here for lower back pain. She had moved to [place] this summer to live with her daughter, but this did not work out very well. Therefore she has moved back to [place], [place] and she will continue to see [place] for medical care. She has been using heat and Tylenol for the pain, but this has not helped much.    Review of Systems  Constitutional: Negative.   Respiratory: Negative.   Cardiovascular: Negative.   Musculoskeletal: Positive for back pain.       Objective:   Physical Exam Constitutional:      Appearance: Normal appearance.  Cardiovascular:     Rate and Rhythm: Normal rate and regular rhythm.     Pulses: Normal pulses.     Heart sounds: Normal heart sounds.  Pulmonary:     Effort: Pulmonary effort is normal.     Breath sounds: Normal breath sounds.  Musculoskeletal:     Comments: Tender in the lower back with full ROM and negative SLR  Neurological:     Mental Status: She is alert.           Assessment & Plan:  Lumbar pain, use Norco as needed.  Gershon Crane, MD

## 2020-05-01 ENCOUNTER — Ambulatory Visit: Payer: Medicare (Managed Care)

## 2020-06-17 ENCOUNTER — Telehealth: Payer: Self-pay | Admitting: Family Medicine

## 2020-06-17 NOTE — Telephone Encounter (Signed)
Pt is calling in stating that she needs a refill on Rx diazepam (VALIUM) 5 MG  [facility]arm[facility] [address] in [place]

## 2020-06-17 NOTE — Telephone Encounter (Signed)
Refill request for:  Diazepam 5 mg  LR 11/20/19, # 90 5 rf's LOV 04/15/20 FOV none scheduled.   Please review and advise.   Thanks.   Dm/cma

## 2020-06-18 MED ORDER — DIAZEPAM 5 MG PO TABS
5.0000 mg | ORAL_TABLET | Freq: Three times a day (TID) | ORAL | 5 refills | Status: DC | PRN
Start: 2020-06-18 — End: 2020-12-30

## 2020-06-18 NOTE — Telephone Encounter (Signed)
Done

## 2020-08-26 ENCOUNTER — Encounter: Payer: Self-pay | Admitting: Family Medicine

## 2020-08-26 ENCOUNTER — Other Ambulatory Visit: Payer: Self-pay

## 2020-08-26 ENCOUNTER — Ambulatory Visit (INDEPENDENT_AMBULATORY_CARE_PROVIDER_SITE_OTHER): Payer: Medicare HMO | Admitting: Family Medicine

## 2020-08-26 VITALS — BP 122/78 | HR 75 | Temp 98.6°F | Wt 108.2 lb

## 2020-08-26 DIAGNOSIS — J439 Emphysema, unspecified: Secondary | ICD-10-CM | POA: Diagnosis not present

## 2020-08-26 DIAGNOSIS — M545 Low back pain, unspecified: Secondary | ICD-10-CM

## 2020-08-26 DIAGNOSIS — E039 Hypothyroidism, unspecified: Secondary | ICD-10-CM

## 2020-08-26 DIAGNOSIS — R2232 Localized swelling, mass and lump, left upper limb: Secondary | ICD-10-CM

## 2020-08-26 DIAGNOSIS — G8929 Other chronic pain: Secondary | ICD-10-CM | POA: Diagnosis not present

## 2020-08-26 DIAGNOSIS — E782 Mixed hyperlipidemia: Secondary | ICD-10-CM | POA: Diagnosis not present

## 2020-08-26 LAB — CBC WITH DIFFERENTIAL/PLATELET
Basophils Absolute: 0.1 10*3/uL (ref 0.0–0.1)
Basophils Relative: 0.9 % (ref 0.0–3.0)
Eosinophils Absolute: 0.3 10*3/uL (ref 0.0–0.7)
Eosinophils Relative: 3.9 % (ref 0.0–5.0)
HCT: 38.8 % (ref 36.0–46.0)
Hemoglobin: 13.2 g/dL (ref 12.0–15.0)
Lymphocytes Relative: 42.5 % (ref 12.0–46.0)
Lymphs Abs: 3 10*3/uL (ref 0.7–4.0)
MCHC: 34 g/dL (ref 30.0–36.0)
MCV: 93.1 fl (ref 78.0–100.0)
Monocytes Absolute: 0.5 10*3/uL (ref 0.1–1.0)
Monocytes Relative: 7.6 % (ref 3.0–12.0)
Neutro Abs: 3.2 10*3/uL (ref 1.4–7.7)
Neutrophils Relative %: 45.1 % (ref 43.0–77.0)
Platelets: 276 10*3/uL (ref 150.0–400.0)
RBC: 4.16 Mil/uL (ref 3.87–5.11)
RDW: 12.6 % (ref 11.5–15.5)
WBC: 7.2 10*3/uL (ref 4.0–10.5)

## 2020-08-26 LAB — BASIC METABOLIC PANEL
BUN: 21 mg/dL (ref 6–23)
CO2: 28 mEq/L (ref 19–32)
Calcium: 10.5 mg/dL (ref 8.4–10.5)
Chloride: 104 mEq/L (ref 96–112)
Creatinine, Ser: 0.87 mg/dL (ref 0.40–1.20)
GFR: 66.27 mL/min (ref >60.00)
Glucose, Bld: 93 mg/dL (ref 70–99)
Potassium: 4.9 mEq/L (ref 3.5–5.1)
Sodium: 141 mEq/L (ref 135–145)

## 2020-08-26 LAB — LIPID PANEL
Cholesterol: 175 mg/dL (ref 0–200)
HDL: 54 mg/dL (ref >39.00)
LDL Cholesterol: 100 mg/dL — ABNORMAL HIGH (ref 0–99)
NonHDL: 120.72
Total CHOL/HDL Ratio: 3
Triglycerides: 102 mg/dL (ref 0.0–149.0)
VLDL: 20.4 mg/dL (ref 0.0–40.0)

## 2020-08-26 LAB — HEPATIC FUNCTION PANEL
ALT: 7 U/L (ref 0–35)
AST: 12 U/L (ref 0–37)
Albumin: 4.4 g/dL (ref 3.5–5.2)
Alkaline Phosphatase: 66 U/L (ref 39–117)
Bilirubin, Direct: 0.2 mg/dL (ref 0.0–0.3)
Total Bilirubin: 0.8 mg/dL (ref 0.2–1.2)
Total Protein: 6.9 g/dL (ref 6.0–8.3)

## 2020-08-26 LAB — T4, FREE: Free T4: 1.68 ng/dL — ABNORMAL HIGH (ref 0.60–1.60)

## 2020-08-26 LAB — TSH: TSH: <0.01 u[IU]/mL — ABNORMAL LOW (ref 0.35–4.50)

## 2020-08-26 LAB — T3, FREE: T3, Free: 4.1 pg/mL (ref 2.3–4.2)

## 2020-08-26 MED ORDER — HYDROCODONE-ACETAMINOPHEN 10-325 MG PO TABS
1.0000 | ORAL_TABLET | ORAL | 0 refills | Status: AC | PRN
Start: 2020-08-26 — End: 2020-08-31

## 2020-08-26 MED ORDER — ALBUTEROL SULFATE HFA 108 (90 BASE) MCG/ACT IN AERS
INHALATION_SPRAY | RESPIRATORY_TRACT | 5 refills | Status: DC
Start: 2020-08-26 — End: 2020-12-31

## 2020-08-26 NOTE — Progress Notes (Signed)
   Subjective:    Patient ID: Brooke Combs, female    DOB: 1946-06-18, 75 y.o.   MRN: [id number]  HPI Here to follow up. She feels fine except for occasional flares of low back pain. She is fasting to check her lipids,etc. She also asks to check some knots that appeared in her left palm about 6 months ago. These are not painful.    Review of Systems  Constitutional: Negative.   Respiratory: Negative.   Cardiovascular: Negative.   Musculoskeletal: Positive for back pain.       Objective:   Physical Exam Constitutional:      Appearance: Normal appearance.  Cardiovascular:     Rate and Rhythm: Normal rate and regular rhythm.     Pulses: Normal pulses.     Heart sounds: Normal heart sounds.  Pulmonary:     Effort: Pulmonary effort is normal.     Breath sounds: Normal breath sounds.  Musculoskeletal:     Comments: There are several non-tender lumps along the flexor tendons in the left palm   Neurological:     Mental Status: She is alert.           Assessment & Plan:  For the hypothyroidism and dyslipidemia, we will get fasting labs today. Her back pain is stable. She has palmar nodules, and we agreed to simply watch them for now.  Gershon Crane, MD

## 2020-09-11 IMAGING — DX CHEST - 2 VIEW
2 series · 2 of 2 positions shown · non-contrast
Comparison: 06/01/2016

CLINICAL DATA: COPD.

EXAM:
CHEST - 2 VIEW

[chest pa]
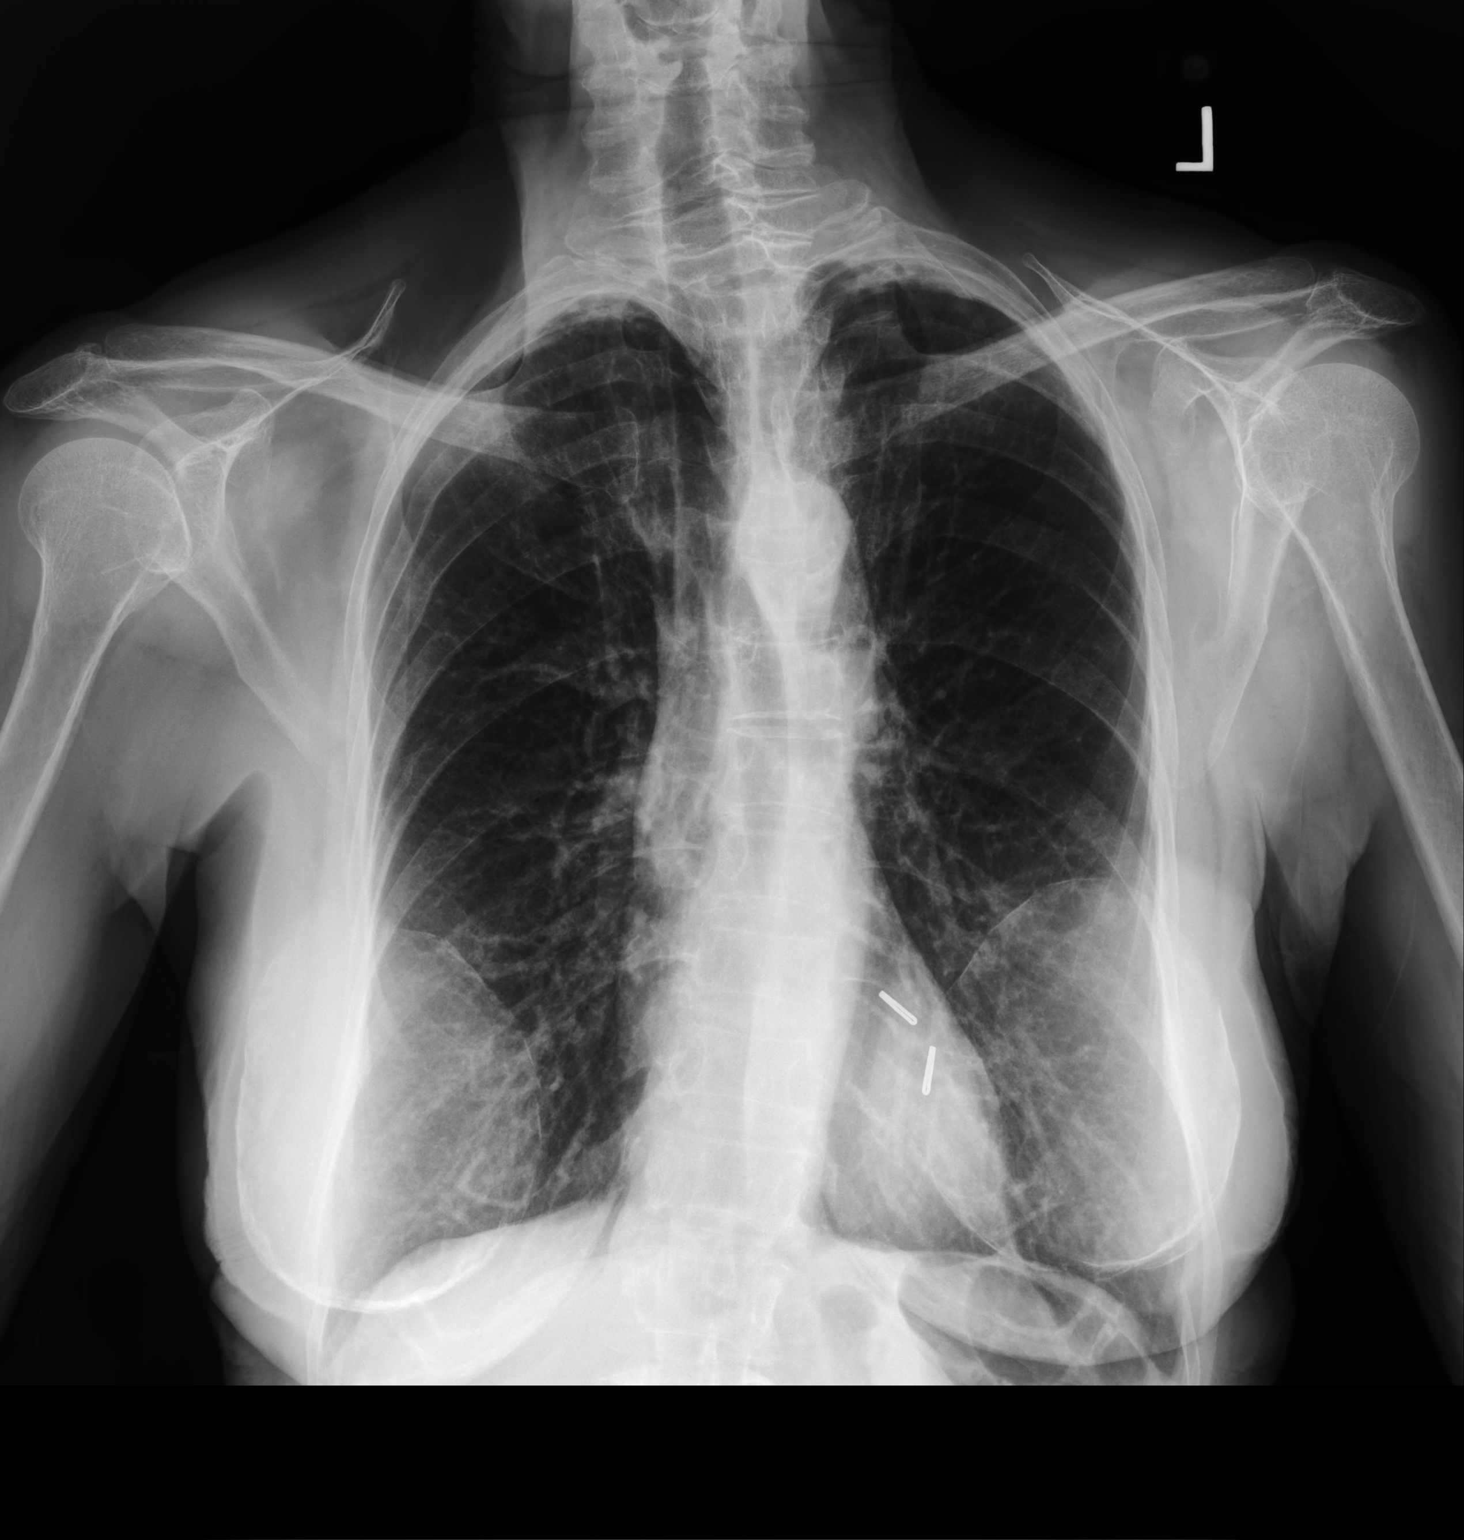

[chest lat]
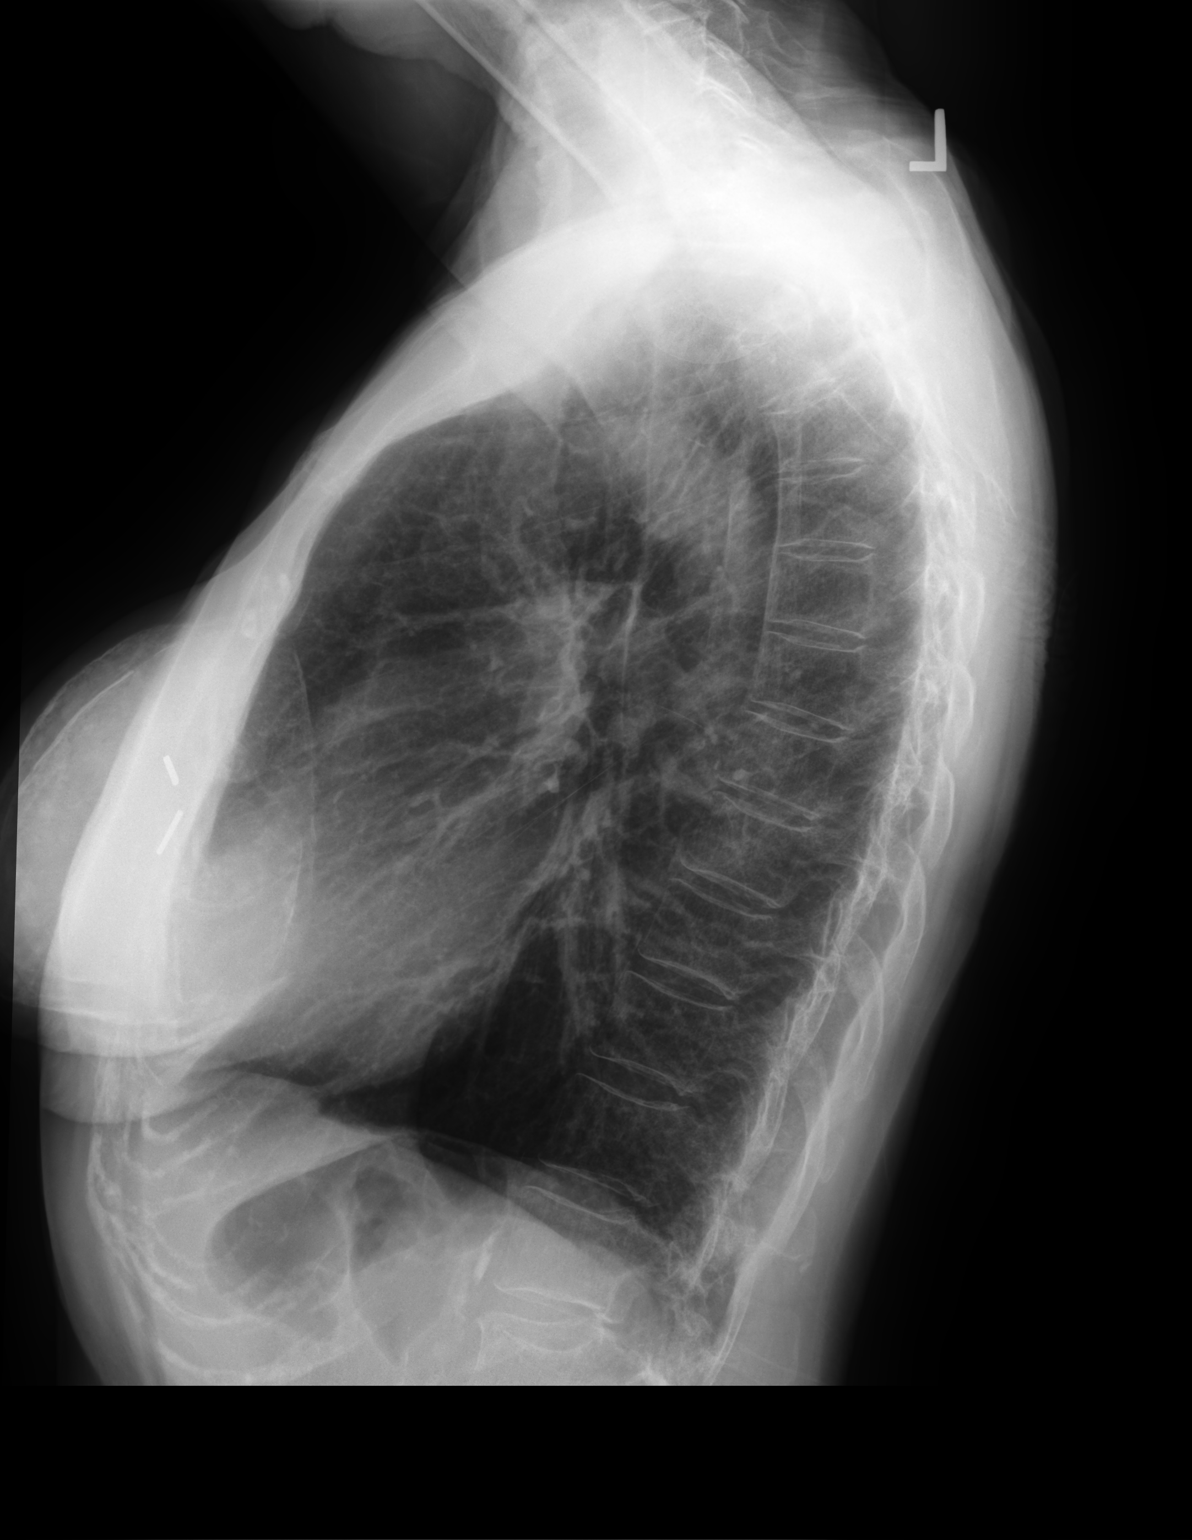

[2 of 2 positions shown; findings below may reference images not displayed]

FINDINGS: Severe hyperinflation and emphysema. Biapical scarring. No confluent
opacity or effusion. Heart is normal size. No acute bony
abnormality. Bilateral calcified breast implants noted.
IMPRESSION: Severe COPD/chronic changes.  No active disease.

## 2020-09-23 ENCOUNTER — Telehealth: Payer: Self-pay | Admitting: Family Medicine

## 2020-09-23 NOTE — Telephone Encounter (Signed)
Spoke with patient to schedule Medicare Annual Wellness Visit (AWV) either virtually or in office. She stated she had a lot going on and will call back to [place]   awvi  please schedule at anytime with [facility] Nurse Health Advisor 1 or 2   This should be a 45 minute visit.

## 2020-11-09 ENCOUNTER — Other Ambulatory Visit: Payer: Self-pay

## 2020-11-09 ENCOUNTER — Encounter: Payer: Self-pay | Admitting: Family Medicine

## 2020-11-09 ENCOUNTER — Ambulatory Visit (INDEPENDENT_AMBULATORY_CARE_PROVIDER_SITE_OTHER): Payer: Medicare HMO | Admitting: Family Medicine

## 2020-11-09 VITALS — BP 122/68 | HR 81 | Temp 98.2°F | Wt 108.6 lb

## 2020-11-09 DIAGNOSIS — J40 Bronchitis, not specified as acute or chronic: Secondary | ICD-10-CM | POA: Diagnosis not present

## 2020-11-09 MED ORDER — HYDROCOD POLST-CPM POLST ER 10-8 MG/5ML PO SUER: 5.0000 mL | Freq: Two times a day (BID) | ORAL | 0 refills | Status: DC | PRN

## 2020-11-09 MED ORDER — AMOXICILLIN-POT CLAVULANATE 875-125 MG PO TABS: 1.0000 | ORAL_TABLET | Freq: Two times a day (BID) | ORAL | 0 refills | Status: DC

## 2020-11-09 NOTE — Progress Notes (Signed)
   Subjective:    Patient ID: Brooke Combs, female    DOB: 03-05-1946, 75 y.o.   MRN: [id number]  HPI Here for 5 days of chest tightness and a dry cough. No SOB or fever. No NVD or body aches. She has tried Mucinex and Tylenol. Drinking fluids.    Review of Systems  Constitutional: Negative.   HENT: Negative.   Eyes: Negative.   Respiratory: Positive for cough and chest tightness. Negative for shortness of breath and wheezing.   Cardiovascular: Negative.        Objective:   Physical Exam Constitutional:      Appearance: Normal appearance.  HENT:     Right Ear: Tympanic membrane, ear canal and external ear normal.     Left Ear: Tympanic membrane, ear canal and external ear normal.     Nose: Nose normal.     Mouth/Throat:     Pharynx: Oropharynx is clear.  Eyes:     Conjunctiva/sclera: Conjunctivae normal.  Pulmonary:     Effort: Pulmonary effort is normal. No respiratory distress.     Breath sounds: Rhonchi present. No wheezing or rales.  Lymphadenopathy:     Cervical: No cervical adenopathy.  Neurological:     Mental Status: She is alert.           Assessment & Plan:  Bronchitis, treat with Augmentin.  Gershon Crane, MD

## 2020-11-28 ENCOUNTER — Other Ambulatory Visit: Payer: Self-pay | Admitting: Family Medicine

## 2020-12-10 ENCOUNTER — Other Ambulatory Visit: Payer: Self-pay | Admitting: Family Medicine

## 2020-12-26 ENCOUNTER — Other Ambulatory Visit: Payer: Self-pay | Admitting: Family Medicine

## 2020-12-28 ENCOUNTER — Other Ambulatory Visit: Payer: Self-pay | Admitting: Family Medicine

## 2020-12-28 NOTE — Telephone Encounter (Signed)
Last office visit- 11/09/20 Last refill- 06/18/20  No future office visit scheduled

## 2020-12-31 ENCOUNTER — Other Ambulatory Visit: Payer: Self-pay | Admitting: Family Medicine

## 2021-01-08 ENCOUNTER — Other Ambulatory Visit: Payer: Self-pay

## 2021-01-11 ENCOUNTER — Encounter: Payer: Self-pay | Admitting: Family Medicine

## 2021-01-11 ENCOUNTER — Other Ambulatory Visit: Payer: Self-pay

## 2021-01-11 ENCOUNTER — Ambulatory Visit (INDEPENDENT_AMBULATORY_CARE_PROVIDER_SITE_OTHER): Payer: Medicare HMO | Admitting: Family Medicine

## 2021-01-11 ENCOUNTER — Ambulatory Visit (INDEPENDENT_AMBULATORY_CARE_PROVIDER_SITE_OTHER)
Admission: RE | Admit: 2021-01-11 | Discharge: 2021-01-11 | Disposition: A | Discharge status: Home / Self Care | Payer: Medicare HMO | Source: Ambulatory Visit | Attending: Family Medicine | Admitting: Family Medicine

## 2021-01-11 VITALS — BP 120/80 | HR 78 | Temp 98.8°F | Wt 108.4 lb

## 2021-01-11 DIAGNOSIS — R0602 Shortness of breath: Secondary | ICD-10-CM | POA: Diagnosis not present

## 2021-01-11 DIAGNOSIS — J439 Emphysema, unspecified: Secondary | ICD-10-CM | POA: Diagnosis not present

## 2021-01-11 DIAGNOSIS — J449 Chronic obstructive pulmonary disease, unspecified: Secondary | ICD-10-CM | POA: Diagnosis not present

## 2021-01-11 DIAGNOSIS — M5441 Lumbago with sciatica, right side: Secondary | ICD-10-CM

## 2021-01-11 DIAGNOSIS — M5442 Lumbago with sciatica, left side: Secondary | ICD-10-CM | POA: Diagnosis not present

## 2021-01-11 DIAGNOSIS — G8929 Other chronic pain: Secondary | ICD-10-CM

## 2021-01-11 DIAGNOSIS — M545 Low back pain, unspecified: Secondary | ICD-10-CM | POA: Insufficient documentation

## 2021-01-11 MED ORDER — HYDROCODONE-ACETAMINOPHEN 10-325 MG PO TABS: 1.0000 | ORAL_TABLET | ORAL | 0 refills | Status: AC | PRN

## 2021-01-11 NOTE — Progress Notes (Signed)
   Subjective:    Patient ID: Brooke Combs, female    DOB: [birth year], 75 y.o.   MRN: [id number]  HPI Here asking for another CXR to follow her COPD. It has been 2 years since she had one. Also she asks for a refill on Hydrocodone to use for her sciatica pain.    Review of Systems  Constitutional: Negative.   Respiratory: Negative.    Cardiovascular: Negative.   Musculoskeletal:  Positive for back pain.      Objective:   Physical Exam Constitutional:      Appearance: Normal appearance.  Cardiovascular:     Rate and Rhythm: Normal rate and regular rhythm.     Pulses: Normal pulses.     Heart sounds: Normal heart sounds.  Pulmonary:     Effort: Pulmonary effort is normal.     Breath sounds: Normal breath sounds.  Neurological:     Mental Status: She is alert.          Assessment & Plan:  COPD, stable. We will get a CXR today. For the sciatica, refill Norco to use as needed.  Gershon Crane, MD

## 2021-01-26 ENCOUNTER — Other Ambulatory Visit: Payer: Self-pay

## 2021-01-27 ENCOUNTER — Ambulatory Visit (INDEPENDENT_AMBULATORY_CARE_PROVIDER_SITE_OTHER): Payer: Medicare HMO | Admitting: Family Medicine

## 2021-01-27 ENCOUNTER — Encounter: Payer: Self-pay | Admitting: Family Medicine

## 2021-01-27 VITALS — BP 132/80 | HR 78 | Temp 98.4°F | Wt 109.1 lb

## 2021-01-27 DIAGNOSIS — J439 Emphysema, unspecified: Secondary | ICD-10-CM

## 2021-01-27 DIAGNOSIS — J449 Chronic obstructive pulmonary disease, unspecified: Secondary | ICD-10-CM

## 2021-01-27 DIAGNOSIS — M5442 Lumbago with sciatica, left side: Secondary | ICD-10-CM

## 2021-01-27 DIAGNOSIS — G8929 Other chronic pain: Secondary | ICD-10-CM

## 2021-01-27 DIAGNOSIS — M5441 Lumbago with sciatica, right side: Secondary | ICD-10-CM

## 2021-01-27 NOTE — Progress Notes (Addendum)
   Subjective:    Patient ID: Brooke Combs, female    DOB: 1946/04/29, 75 y.o.   MRN: [id number]  HPI Here asking for my advice. She is considering applying for disability. 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 She has exhausted all of her retirement savings by helping other family members, but now none of them are willing to help her out. She is currently living with a friend, but she will need to move out soon.    Review of Systems  Constitutional: Negative.   Respiratory: Negative.    Cardiovascular: Negative.   Musculoskeletal:  Positive for back pain.      Objective:   Physical Exam Constitutional:      Appearance: Normal appearance.  Cardiovascular:     Rate and Rhythm: Normal rate and regular rhythm.     Pulses: Normal pulses.     Heart sounds: Normal heart sounds.  Pulmonary:     Effort: Pulmonary effort is normal.     Breath sounds: Normal breath sounds.  Neurological:     Mental Status: She is alert.          Assessment & Plan:  She certainly has medical problems, including severe low back pain and COPD, that prevent her from working. I told her I would support her application as far as medical issues go, but she will need to assistance of a disability attorney. She will consult the attorney and then let me know what she needs at that point.  Gershon Crane, MD

## 2021-03-04 ENCOUNTER — Telehealth: Payer: Self-pay | Admitting: Family Medicine

## 2021-03-04 NOTE — Telephone Encounter (Signed)
Left message for patient to call back and schedule Medicare Annual Wellness Visit (AWV) either virtually or in office. Left  my jabber number [phone]   awvi 07/18/13 per [facility]  please schedule at anytime with [facility] Nurse Health Advisor 1 or 2   This should be a 45 minute visit.  

## 2021-03-05 ENCOUNTER — Telehealth: Payer: Self-pay | Admitting: Family Medicine

## 2021-03-05 NOTE — Chronic Care Management (AMB) (Signed)
  Chronic Care Management   Outreach Note  03/05/2021 Name: Brooke Combs MRN: [id number] DOB: February 20, 1946  Referred by: Nelwyn Salisbury, MD Reason for referral : No chief complaint on file.   An unsuccessful telephone outreach was attempted today. The patient was referred to the pharmacist for assistance with care management and care coordination.   Follow Up Plan:   Tatjana Dellinger Upstream Scheduler

## 2021-03-12 ENCOUNTER — Telehealth: Payer: Self-pay | Admitting: Family Medicine

## 2021-03-12 NOTE — Chronic Care Management (AMB) (Signed)
  Chronic Care Management   Outreach Note  03/12/2021 Name: Brooke Combs MRN: [id number] DOB: 01/01/1946  Referred by: Nelwyn Salisbury, MD Reason for referral : No chief complaint on file.   A second unsuccessful telephone outreach was attempted today. The patient was referred to pharmacist for assistance with care management and care coordination.  Follow Up Plan:   Tatjana Dellinger Upstream Scheduler

## 2021-03-14 ENCOUNTER — Other Ambulatory Visit: Payer: Self-pay | Admitting: Family Medicine

## 2021-03-19 ENCOUNTER — Telehealth: Payer: Self-pay | Admitting: Family Medicine

## 2021-03-19 NOTE — Progress Notes (Signed)
  Chronic Care Management   Outreach Note  03/19/2021 Name: Brooke Combs MRN: [id number] DOB: [birth year]  Referred by: Nelwyn Salisbury, MD Reason for referral : No chief complaint on file.   Third unsuccessful telephone outreach was attempted today. The patient was referred to the pharmacist for assistance with care management and care coordination.   Follow Up Plan:   Tatjana Dellinger Upstream Scheduler

## 2021-03-31 ENCOUNTER — Telehealth: Payer: Self-pay | Admitting: Family Medicine

## 2021-03-31 ENCOUNTER — Other Ambulatory Visit: Payer: Self-pay

## 2021-03-31 ENCOUNTER — Encounter: Payer: Self-pay | Admitting: Family Medicine

## 2021-03-31 ENCOUNTER — Ambulatory Visit (INDEPENDENT_AMBULATORY_CARE_PROVIDER_SITE_OTHER): Payer: Medicare HMO | Admitting: Family Medicine

## 2021-03-31 VITALS — BP 142/78 | HR 100 | Temp 98.9°F | Wt 111.1 lb

## 2021-03-31 DIAGNOSIS — M542 Cervicalgia: Secondary | ICD-10-CM

## 2021-03-31 DIAGNOSIS — M26651 Arthropathy of right temporomandibular joint: Secondary | ICD-10-CM

## 2021-03-31 MED ORDER — CYCLOBENZAPRINE HCL 10 MG PO TABS: 10.0000 mg | ORAL_TABLET | Freq: Three times a day (TID) | ORAL | 2 refills | Status: DC | PRN

## 2021-03-31 MED ORDER — METHYLPREDNISOLONE 4 MG PO TBPK: ORAL_TABLET | ORAL | 0 refills | Status: DC

## 2021-03-31 NOTE — Telephone Encounter (Addendum)
Patient is aware that her information has [facility]   Your information has been submitted to [employer]. [employer] will review the request and will issue a decision, typically within 1-3 days from your submission. You can check the updated outcome later by reopening this request.  If [employer] has not responded in 1-3 days or if you have any questions about your ePA request, please contact [employer] at [phone]. If you think there may be a problem with your PA request, use our live chat feature at the bottom right.

## 2021-03-31 NOTE — Telephone Encounter (Signed)
Noted. Message complete

## 2021-03-31 NOTE — Progress Notes (Signed)
   Subjective:    Patient ID: Brooke Combs, female    DOB: Jul 20, 1945, 75 y.o.   MRN: [id number]  HPI Here for 2 issues. First about 2 weeks ago she developed pain in the right jaw which makes chewing painful. She has a hx of TMJ. Also she woke up in the middle of the night last night with a sharp pain in the right neck and upper back. The pain does not radiate to the arm. No recent trauma.    Review of Systems  Constitutional: Negative.   HENT: Negative.    Respiratory: Negative.    Cardiovascular: Negative.   Musculoskeletal:  Positive for neck pain.      Objective:   Physical Exam Constitutional:      Appearance: Normal appearance. She is not ill-appearing.  HENT:     Mouth/Throat:     Comments: The right TMJ is quite tender and ROM is limited Cardiovascular:     Rate and Rhythm: Normal rate and regular rhythm.     Pulses: Normal pulses.     Heart sounds: Normal heart sounds.  Pulmonary:     Effort: Pulmonary effort is normal.     Breath sounds: Normal breath sounds.  Musculoskeletal:     Comments: She is quite tender on the right side of the posterior lower neck and across the right upper back. ROM of her neck is quite limited by pain   Neurological:     Mental Status: She is alert.          Assessment & Plan:  She has a pinched nerve in the neck as well as a flare of TMJ. Treat both with moist heat, a Medrol dose pack, and Flexeril. Recheck as needed.  Gershon Crane, MD

## 2021-03-31 NOTE — Telephone Encounter (Signed)
PT called to advise that when they went to get their medication cyclobenzaprine (FLEXERIL) 10 MG tablet the pharmacy advise they need auth before they can fill it. Please advise.

## 2021-03-31 NOTE — Telephone Encounter (Signed)
Patient called again to let [facility] know [facility]

## 2021-04-01 ENCOUNTER — Telehealth: Payer: Self-pay

## 2021-04-01 NOTE — Telephone Encounter (Signed)
Received a fax with approval of Flexeril 10 mg through 06/29/2021

## 2021-04-05 ENCOUNTER — Telehealth: Payer: Self-pay | Admitting: Family Medicine

## 2021-04-05 NOTE — Telephone Encounter (Signed)
PT called to relay that she is still in pain since her visit on 9/14. PT states she has severe pain in her side and has been unable to use the bathroom for 5 days and has even taken stool softener. She would like some advise on what to do.

## 2021-04-06 NOTE — Telephone Encounter (Signed)
FYI Called pt states that she has not had a  Bowel movement in 5 days, states that the medication that she took for the TMJ pain caused her constipation,states that she has has tried taking miralax / Enema  but nothing is helping. pt also states that she is having severe Left side Sciatica nerve pain but she has an appointment scheduled for tomorrow afternoon (04/07/21)

## 2021-04-06 NOTE — Telephone Encounter (Signed)
Please get more information about what is going on

## 2021-04-06 NOTE — Telephone Encounter (Signed)
Please advise 

## 2021-04-07 ENCOUNTER — Other Ambulatory Visit: Payer: Self-pay

## 2021-04-07 ENCOUNTER — Ambulatory Visit (INDEPENDENT_AMBULATORY_CARE_PROVIDER_SITE_OTHER): Payer: Medicare HMO | Admitting: Family Medicine

## 2021-04-07 ENCOUNTER — Encounter: Payer: Self-pay | Admitting: Family Medicine

## 2021-04-07 VITALS — BP 146/70 | HR 99 | Temp 99.5°F | Wt 112.0 lb

## 2021-04-07 DIAGNOSIS — R1032 Left lower quadrant pain: Secondary | ICD-10-CM

## 2021-04-07 LAB — POC URINALSYSI DIPSTICK (AUTOMATED)
Bilirubin, UA: NEGATIVE
Blood, UA: NEGATIVE
Glucose, UA: NEGATIVE
Leukocytes, UA: NEGATIVE
Nitrite, UA: NEGATIVE
Protein, UA: POSITIVE — AB
Spec Grav, UA: >=1.03 — AB (ref 1.010–1.025)
Urobilinogen, UA: 0.2 E.U./dL
pH, UA: 5.5 (ref 5.0–8.0)

## 2021-04-07 MED ORDER — CEFTRIAXONE SODIUM 1 G IJ SOLR
1.0000 g | Freq: Once | INTRAMUSCULAR | Status: AC
  Administered 2021-04-07: 1 g via INTRAMUSCULAR

## 2021-04-07 MED ORDER — METRONIDAZOLE 500 MG PO TABS: 500.0000 mg | ORAL_TABLET | Freq: Three times a day (TID) | ORAL | 0 refills | Status: DC

## 2021-04-07 MED ORDER — CIPROFLOXACIN HCL 500 MG PO TABS: 500.0000 mg | ORAL_TABLET | Freq: Two times a day (BID) | ORAL | 0 refills | Status: DC

## 2021-04-07 NOTE — Addendum Note (Signed)
Addended by: Carola Rhine on: 04/07/2021 04:05 PM   Modules accepted: Orders

## 2021-04-07 NOTE — Addendum Note (Signed)
Addended by: Kandra Nicolas on: 04/07/2021 02:39 PM   Modules accepted: Orders

## 2021-04-07 NOTE — Telephone Encounter (Signed)
We will see her today

## 2021-04-07 NOTE — Addendum Note (Signed)
Addended by: Carola Rhine on: 04/07/2021 04:30 PM   Modules accepted: Orders

## 2021-04-07 NOTE — Progress Notes (Signed)
   Subjective:    Patient ID: Brooke Combs, female    DOB: Mar 10, 1946, 75 y.o.   MRN: [id number]  HPI Here for 2 days of moderate to severe LLQ abdominal pain. She was here on 03-31-21 with TMJ pain, and we prescribed Flexeril and a Medrol dose pack. After taking these for several days she became constipated so she stopped taking them. Normally she rarely has trouble with constipation. She has now gone 5 days without a BM. She has pain in the LLQ the left flank, and the left side of the lower back. She has a fever today, but she has not been aware of any fever. No urinary symptoms. No nausea or vomiting. Her appetite is poor but she has been drinking lots of water. She has never had a colonoscopy or an abdominal CT.    Review of Systems  Constitutional:  Positive for fever.  Respiratory: Negative.    Cardiovascular: Negative.   Gastrointestinal:  Positive for abdominal distention, abdominal pain and constipation. Negative for anal bleeding, blood in stool, diarrhea, nausea and vomiting.  Genitourinary: Negative.       Objective:   Physical Exam Constitutional:      Comments: In pain  Cardiovascular:     Rate and Rhythm: Normal rate and regular rhythm.     Pulses: Normal pulses.     Heart sounds: Normal heart sounds.  Pulmonary:     Effort: Pulmonary effort is normal.     Breath sounds: Normal breath sounds.  Abdominal:     General: Bowel sounds are normal. There is distension.     Palpations: Abdomen is soft. There is no mass.     Tenderness: There is left CVA tenderness. There is no guarding.     Hernia: No hernia is present.     Comments: She is very tender in the LLQ and the left flank with positive rebound   Neurological:     Mental Status: She is alert.          Assessment & Plan:  LLQ pain, likely due to diverticulitis. She is given 1000 mg IM of Rocephin. She will take 10 days of Cipro and Flagyl. Get labs today including a CBC. We will set up an abdominal CT  scan asap. We will follow up with her tomorrow.  We spent a total of ( 39  ) minutes reviewing records and discussing these issues.  Gershon Crane, MD

## 2021-04-08 ENCOUNTER — Ambulatory Visit
Admission: RE | Admit: 2021-04-08 | Discharge: 2021-04-08 | Disposition: A | Discharge status: Home / Self Care | Payer: Medicare HMO | Source: Ambulatory Visit | Attending: Family Medicine | Admitting: Family Medicine

## 2021-04-08 ENCOUNTER — Emergency Department (HOSPITAL_COMMUNITY): Payer: Medicare HMO

## 2021-04-08 ENCOUNTER — Telehealth: Payer: Self-pay | Admitting: Family Medicine

## 2021-04-08 ENCOUNTER — Encounter (HOSPITAL_COMMUNITY): Payer: Self-pay

## 2021-04-08 ENCOUNTER — Emergency Department (HOSPITAL_COMMUNITY)
Admission: EM | Admit: 2021-04-08 | Discharge: 2021-04-08 | Disposition: A | Discharge status: Home / Self Care | Payer: Medicare HMO | Attending: Emergency Medicine | Admitting: Emergency Medicine

## 2021-04-08 ENCOUNTER — Other Ambulatory Visit: Payer: Self-pay

## 2021-04-08 DIAGNOSIS — E039 Hypothyroidism, unspecified: Secondary | ICD-10-CM | POA: Insufficient documentation

## 2021-04-08 DIAGNOSIS — Z79899 Other long term (current) drug therapy: Secondary | ICD-10-CM | POA: Insufficient documentation

## 2021-04-08 DIAGNOSIS — Z87891 Personal history of nicotine dependence: Secondary | ICD-10-CM | POA: Insufficient documentation

## 2021-04-08 DIAGNOSIS — Z7951 Long term (current) use of inhaled steroids: Secondary | ICD-10-CM | POA: Diagnosis not present

## 2021-04-08 DIAGNOSIS — K7689 Other specified diseases of liver: Secondary | ICD-10-CM | POA: Diagnosis not present

## 2021-04-08 DIAGNOSIS — N132 Hydronephrosis with renal and ureteral calculous obstruction: Secondary | ICD-10-CM | POA: Insufficient documentation

## 2021-04-08 DIAGNOSIS — R109 Unspecified abdominal pain: Secondary | ICD-10-CM | POA: Diagnosis not present

## 2021-04-08 DIAGNOSIS — K449 Diaphragmatic hernia without obstruction or gangrene: Secondary | ICD-10-CM | POA: Diagnosis not present

## 2021-04-08 DIAGNOSIS — R1032 Left lower quadrant pain: Secondary | ICD-10-CM | POA: Diagnosis not present

## 2021-04-08 DIAGNOSIS — K219 Gastro-esophageal reflux disease without esophagitis: Secondary | ICD-10-CM | POA: Diagnosis not present

## 2021-04-08 DIAGNOSIS — J449 Chronic obstructive pulmonary disease, unspecified: Secondary | ICD-10-CM | POA: Insufficient documentation

## 2021-04-08 DIAGNOSIS — Z20822 Contact with and (suspected) exposure to covid-19: Secondary | ICD-10-CM | POA: Insufficient documentation

## 2021-04-08 DIAGNOSIS — K5790 Diverticulosis of intestine, part unspecified, without perforation or abscess without bleeding: Secondary | ICD-10-CM | POA: Diagnosis not present

## 2021-04-08 DIAGNOSIS — N201 Calculus of ureter: Secondary | ICD-10-CM | POA: Diagnosis not present

## 2021-04-08 LAB — HEPATIC FUNCTION PANEL
ALT: 6 U/L (ref 0–35)
AST: 37 U/L (ref 0–37)
Albumin: 3.6 g/dL (ref 3.5–5.2)
Alkaline Phosphatase: 61 U/L (ref 39–117)
Bilirubin, Direct: 0.5 mg/dL — ABNORMAL HIGH (ref 0.0–0.3)
Total Bilirubin: 1.8 mg/dL — ABNORMAL HIGH (ref 0.2–1.2)
Total Protein: 7 g/dL (ref 6.0–8.3)

## 2021-04-08 LAB — CBC WITH DIFFERENTIAL/PLATELET
Abs Immature Granulocytes: 0.06 10*3/uL (ref 0.00–0.07)
Basophils Absolute: 0.1 10*3/uL (ref 0.0–0.1)
Basophils Absolute: 0 10*3/uL (ref 0.0–0.1)
Basophils Relative: 0.6 % (ref 0.0–3.0)
Basophils Relative: 0 %
Eosinophils Absolute: 0.1 10*3/uL (ref 0.0–0.7)
Eosinophils Absolute: 0.1 10*3/uL (ref 0.0–0.5)
Eosinophils Relative: 0.6 % (ref 0.0–5.0)
Eosinophils Relative: 1 %
HCT: 35.6 % — ABNORMAL LOW (ref 36.0–46.0)
HCT: 35.5 % — ABNORMAL LOW (ref 36.0–46.0)
Hemoglobin: 12.3 g/dL (ref 12.0–15.0)
Hemoglobin: 11.8 g/dL — ABNORMAL LOW (ref 12.0–15.0)
Immature Granulocytes: 0 %
Lymphocytes Relative: 12.8 % (ref 12.0–46.0)
Lymphocytes Relative: 15 %
Lymphs Abs: 2.2 10*3/uL (ref 0.7–4.0)
Lymphs Abs: 2.2 10*3/uL (ref 0.7–4.0)
MCH: 30.9 pg (ref 26.0–34.0)
MCHC: 34.7 g/dL (ref 30.0–36.0)
MCHC: 33.2 g/dL (ref 30.0–36.0)
MCV: 91.3 fl (ref 78.0–100.0)
MCV: 92.9 fL (ref 80.0–100.0)
Monocytes Absolute: 1.6 10*3/uL — ABNORMAL HIGH (ref 0.1–1.0)
Monocytes Absolute: 1.8 10*3/uL — ABNORMAL HIGH (ref 0.1–1.0)
Monocytes Relative: 9.4 % (ref 3.0–12.0)
Monocytes Relative: 13 %
Neutro Abs: 13 10*3/uL — ABNORMAL HIGH (ref 1.4–7.7)
Neutro Abs: 10 10*3/uL — ABNORMAL HIGH (ref 1.7–7.7)
Neutrophils Relative %: 76.6 % (ref 43.0–77.0)
Neutrophils Relative %: 71 %
Platelets: 217 10*3/uL (ref 150.0–400.0)
Platelets: 312 10*3/uL (ref 150–400)
RBC: 3.9 Mil/uL (ref 3.87–5.11)
RBC: 3.82 MIL/uL — ABNORMAL LOW (ref 3.87–5.11)
RDW: 12.5 % (ref 11.5–15.5)
RDW: 11.9 % (ref 11.5–15.5)
WBC: 16.9 10*3/uL — ABNORMAL HIGH (ref 4.0–10.5)
WBC: 14.3 10*3/uL — ABNORMAL HIGH (ref 4.0–10.5)
nRBC: 0 % (ref 0.0–0.2)

## 2021-04-08 LAB — URINALYSIS, ROUTINE W REFLEX MICROSCOPIC
Bilirubin Urine: NEGATIVE
Glucose, UA: NEGATIVE mg/dL
Ketones, ur: 15 mg/dL — AB
Leukocytes,Ua: NEGATIVE
Nitrite: NEGATIVE
Specific Gravity, Urine: >=1.03 (ref 1.005–1.030)
pH: 5.5 (ref 5.0–8.0)

## 2021-04-08 LAB — BASIC METABOLIC PANEL
BUN: 23 mg/dL (ref 6–23)
CO2: 25 mEq/L (ref 19–32)
Calcium: 9 mg/dL (ref 8.4–10.5)
Chloride: 93 mEq/L — ABNORMAL LOW (ref 96–112)
Creatinine, Ser: 1.44 mg/dL — ABNORMAL HIGH (ref 0.40–1.20)
GFR: 36.04 mL/min — ABNORMAL LOW (ref >60.00)
Glucose, Bld: 59 mg/dL — ABNORMAL LOW (ref 70–99)
Potassium: 3.2 mEq/L — ABNORMAL LOW (ref 3.5–5.1)
Sodium: 135 mEq/L (ref 135–145)

## 2021-04-08 LAB — COMPREHENSIVE METABOLIC PANEL
ALT: 12 U/L (ref 0–44)
AST: 22 U/L (ref 15–41)
Albumin: 3.3 g/dL — ABNORMAL LOW (ref 3.5–5.0)
Alkaline Phosphatase: 56 U/L (ref 38–126)
Anion gap: 11 (ref 5–15)
BUN: 27 mg/dL — ABNORMAL HIGH (ref 8–23)
CO2: 28 mmol/L (ref 22–32)
Calcium: 8.8 mg/dL — ABNORMAL LOW (ref 8.9–10.3)
Chloride: 95 mmol/L — ABNORMAL LOW (ref 98–111)
Creatinine, Ser: 1.35 mg/dL — ABNORMAL HIGH (ref 0.44–1.00)
GFR, Estimated: 41 mL/min — ABNORMAL LOW (ref >60)
Glucose, Bld: 112 mg/dL — ABNORMAL HIGH (ref 70–99)
Potassium: 3.4 mmol/L — ABNORMAL LOW (ref 3.5–5.1)
Sodium: 134 mmol/L — ABNORMAL LOW (ref 135–145)
Total Bilirubin: 1.4 mg/dL — ABNORMAL HIGH (ref 0.3–1.2)
Total Protein: 7.3 g/dL (ref 6.5–8.1)

## 2021-04-08 LAB — RESP PANEL BY RT-PCR (FLU A&B, COVID) ARPGX2
Influenza A by PCR: NEGATIVE
Influenza B by PCR: NEGATIVE
SARS Coronavirus 2 by RT PCR: NEGATIVE

## 2021-04-08 LAB — LIPASE: Lipase: 6 U/L — ABNORMAL LOW (ref 11.0–59.0)

## 2021-04-08 LAB — AMYLASE: Amylase: 14 U/L — ABNORMAL LOW (ref 27–131)

## 2021-04-08 LAB — LACTIC ACID, PLASMA: Lactic Acid, Venous: 1.1 mmol/L (ref 0.5–1.9)

## 2021-04-08 LAB — LIPASE, BLOOD: Lipase: 31 U/L (ref 11–51)

## 2021-04-08 MED ORDER — IOHEXOL 350 MG/ML SOLN
60.0000 mL | Freq: Once | INTRAVENOUS | Status: AC | PRN
  Administered 2021-04-08: 60 mL via INTRAVENOUS

## 2021-04-08 MED ORDER — SODIUM CHLORIDE 0.9 % IV BOLUS
1000.0000 mL | Freq: Once | INTRAVENOUS | Status: AC
Start: 2021-04-08 — End: 2021-04-08
  Administered 2021-04-08: 1000 mL via INTRAVENOUS

## 2021-04-08 MED ORDER — FENTANYL CITRATE PF 50 MCG/ML IJ SOSY
50.0000 ug | PREFILLED_SYRINGE | Freq: Once | INTRAMUSCULAR | Status: AC
  Administered 2021-04-08: 50 ug via INTRAVENOUS
  Filled 2021-04-08: qty 1

## 2021-04-08 MED ORDER — MORPHINE SULFATE (PF) 4 MG/ML IV SOLN
4.0000 mg | Freq: Once | INTRAVENOUS | Status: AC
  Administered 2021-04-08: 2 mg via INTRAVENOUS
  Filled 2021-04-08: qty 1

## 2021-04-08 MED ORDER — OXYCODONE-ACETAMINOPHEN 5-325 MG PO TABS: 1.0000 | ORAL_TABLET | Freq: Four times a day (QID) | ORAL | 0 refills | Status: DC | PRN

## 2021-04-08 MED ORDER — FENTANYL CITRATE PF 50 MCG/ML IJ SOSY
25.0000 ug | PREFILLED_SYRINGE | Freq: Once | INTRAMUSCULAR | Status: AC
Start: 2021-04-08 — End: 2021-04-08
  Administered 2021-04-08: 25 ug via INTRAVENOUS
  Filled 2021-04-08: qty 1

## 2021-04-08 NOTE — Telephone Encounter (Signed)
The office received a call from [facility] Regarding pt CT still being under review by insurance, Reached out to our referral coordinator Dorris Singh when looked up referral, state that it was still under review, Dr Clent Ridges was notified since this referral was STAT, state to call pt and advise to go to the ED where she can have the Imaging done ASAP and to get treatment, pt verbalized understanding

## 2021-04-08 NOTE — Telephone Encounter (Signed)
As above. She agreed to go to the ER now because we cannot wait for the insurance company to approve it.

## 2021-04-08 NOTE — ED Provider Notes (Signed)
[facility]-EMERGENCY DEPT Provider Note   CSN: [id number] Arrival date & time: 04/08/21  1247     History Chief Complaint  Patient presents with   Abdominal Pain    Brooke Combs is a 75 y.o. female who presents to the ED today with complaint of gradual onset, constant, sharp, LLQ abdominal pain that began 1 week ago.  She also complains of constipation and states she has not had a bowel movement in 6 days.  She states she is still passing gas however.  She went to her PCPs office yesterday and was clinically diagnosed with diverticulitis started on Cipro and Flagyl.  She states that she has taken a total of 2 doses, one last night and 1 this morning.  She states she went back to her PCPs office today who planned to have a CT scan done as she was not having any improvement in her symptoms however there is issues with insurance and so she was advised to come to the ED for CT scan.  Patient denies any fevers or chills.  She denies history of diverticulitis.  She has no nausea or vomiting.  She does report that she has had a hysterectomy surgical repair in the past as well as a ex lap when she was 11 after swallowing a quarter.   The history is provided by the patient and medical records.      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 No pertinent surgical history.   OB History   No  obstetric history on file.     Family History  Problem Relation Age of Onset   Lupus Sister    Hypertension Sister     Social History   Tobacco Use   Smoking status: Former    Years: 30.00    Types: Cigarettes   Smokeless tobacco: Never   Tobacco comments:    1/2 pack per day  Substance Use Topics   Alcohol use: No    Alcohol/week: 0.0 standard drinks   Drug use: No    Home Medications Prior to Admission medications   Medication Sig Start Date End Date Taking? Authorizing Provider  oxyCODONE-acetaminophen (PERCOCET/ROXICET) 5-325 MG tablet Take 1 tablet by mouth every 6 (six) hours as needed for severe pain. 04/08/21  Yes Evelisse Szalkowski, PA-C  albuterol (VENTOLIN HFA) 108 (90 Base) MCG/ACT inhaler INHALE 2 PUFFS BY MOUTH EVERY 4 HOURS AS NEEDED FOR WHEEZING OR SHORTNESS OF BREATH 03/15/21   Nelwyn Salisbury, MD  aspirin 81 MG tablet Take 81 mg by mouth as needed.     [provider]  chlorpheniramine-HYDROcodone (TUSSIONEX PENNKINETIC ER) 10-8 MG/5ML SUER Take 5 mLs by mouth every 12 (twelve) hours as needed for cough. 11/09/20   Nelwyn Salisbury, MD  Cholecalciferol (VITAMIN D) 2000 units CAPS Take by mouth daily.    [provider]  ciprofloxacin (CIPRO) 500 MG tablet Take 1 tablet (500 mg total) by mouth 2 (two) times daily for 10 days. 04/07/21 04/17/21  Nelwyn Salisbury, MD  cyclobenzaprine (FLEXERIL) 10 MG tablet Take 1 tablet (10 mg total) by mouth 3 (three) times daily as needed for muscle spasms. 03/31/21   Nelwyn Salisbury, MD  diazepam (VALIUM) 5 MG tablet TAKE 1 TABLET BY MOUTH EVERY 8 HOURS AS NEEDED. 12/30/20   Nelwyn Salisbury, MD  esomeprazole (NEXIUM) 20 MG capsule Take 1 capsule (20 mg total) by mouth 2 (two) times daily before a meal. Over then counter 01/26/18   Nelwyn Salisbury, MD  FLOVENT HFA 44 MCG/ACT inhaler INHALE 2 PUFFS TWICE A DAY (IN MORNING AND AT BEDTIME) 12/02/20   Nelwyn Salisbury, MD  metroNIDAZOLE (FLAGYL) 500 MG tablet Take 1 tablet (500 mg  total) by mouth 3 (three) times daily for 10 days. 04/07/21 04/17/21  Nelwyn Salisbury, MD  simvastatin (ZOCOR) 40 MG tablet TAKE 1 TABLET BY MOUTH EVERYDAY AT BEDTIME 12/10/20   Nelwyn Salisbury, MD  SYNTHROID 100 MCG tablet TAKE 1 TABLET BY MOUTH EVERY DAY BEFORE BREAKFAST 12/28/20   Nelwyn Salisbury, MD  zolpidem (AMBIEN) 10 MG tablet Take 1 tablet (10 mg total) by mouth at bedtime as needed for sleep. 01/16/20   Nelwyn Salisbury, MD    Allergies    Celecoxib, Etodolac, and Tramadol  Review of Systems   Review of Systems  Constitutional:  Negative for chills and fever.  Gastrointestinal:  Positive for abdominal pain and constipation. Negative for diarrhea, nausea and vomiting.  All other systems reviewed and are negative.  Physical Exam Updated Vital Signs BP 131/76   Pulse 97   Temp 98.2 F (36.8 C) (Oral)   Resp 19   SpO2 97%   Physical Exam Vitals and nursing note reviewed.  Constitutional:      Appearance: She is not ill-appearing or diaphoretic.  HENT:     Head: Normocephalic and atraumatic.  Eyes:     Conjunctiva/sclera: Conjunctivae normal.  Cardiovascular:     Rate and Rhythm: Normal rate and regular rhythm.     Heart sounds: Normal heart sounds.  Pulmonary:     Effort: Pulmonary effort is normal.     Breath sounds: Normal breath sounds. No wheezing, rhonchi or rales.  Abdominal:     General: Bowel sounds are normal.     Palpations: Abdomen is soft.     Tenderness: There is abdominal tenderness.     Comments: Soft, diffuse abdominal TTP however worse in the LLQ, +BS throughout, no r/g/r, neg murphy's, neg mcburney's, no CVA TTP  Musculoskeletal:     Cervical back: Neck supple.  Skin:    General: Skin is warm and dry.  Neurological:     Mental Status: She is alert.    ED Results / Procedures / Treatments   Labs (all labs ordered are listed, but only abnormal results are displayed) Labs Reviewed  COMPREHENSIVE METABOLIC PANEL - Abnormal; Notable for the following  components:      Result Value   Sodium 134 (*)    Potassium 3.4 (*)    Chloride 95 (*)    Glucose, Bld 112 (*)    BUN 27 (*)    Creatinine, Ser 1.35 (*)    Calcium 8.8 (*)    Albumin 3.3 (*)    Total Bilirubin 1.4 (*)    GFR, Estimated 41 (*)    All other components  within normal limits  URINALYSIS, ROUTINE W REFLEX MICROSCOPIC - Abnormal; Notable for the following components:   Hgb urine dipstick SMALL (*)    Ketones, ur 15 (*)    Protein, ur TRACE (*)    Bacteria, UA RARE (*)    All other components within normal limits  CBC WITH DIFFERENTIAL/PLATELET - Abnormal; Notable for the following components:   WBC 14.3 (*)    RBC 3.82 (*)    Hemoglobin 11.8 (*)    HCT 35.5 (*)    Neutro Abs 10.0 (*)    Monocytes Absolute 1.8 (*)    All other components within normal limits  RESP PANEL BY RT-PCR (FLU A&B, COVID) ARPGX2  LIPASE, BLOOD  LACTIC ACID, PLASMA    EKG None  Radiology CT ABDOMEN PELVIS W CONTRAST  Result Date: 04/08/2021 CLINICAL DATA:  Left lower quadrant pain for a few days EXAM: CT ABDOMEN AND PELVIS WITH CONTRAST TECHNIQUE: Multidetector CT imaging of the abdomen and pelvis was performed using the standard protocol following bolus administration of intravenous contrast. CONTRAST:  99mL OMNIPAQUE IOHEXOL 350 MG/ML SOLN COMPARISON:  None. FINDINGS: Lower chest: Mild left basilar atelectasis is noted. No sizable effusion is seen. Calcified breast implants are noted bilaterally. Hepatobiliary: Hepatic cysts are noted. The largest of these measures up to 3.8 cm. Gallbladder is partially distended but within normal limits. Pancreas: Unremarkable. No pancreatic ductal dilatation or surrounding inflammatory changes. Spleen: Normal in size without focal abnormality. Adrenals/Urinary Tract: Adrenal glands are within normal limits. Right kidney demonstrates a normal enhancement pattern with normal excretion on delayed images. No calculi are noted. The left kidney demonstrates a  normal enhancement pattern although hydronephrosis and hydroureter is seen as well as a significant amount of perinephric stranding and mild fluid extending in the inferior aspect of the perirenal space. The free fluid in the perirenal space may be related to a ruptured calyx. Enhancement of the collecting system wall and ureter on the left is seen which may be related to underlying UTI. In the distal left ureter at the level of the sacrum there is an 8.8 mm obstructing stone identified adjacent to the common iliac artery bifurcation on the left. The more distal left ureter appears within normal limits. The bladder is partially distended. Stomach/Bowel: Minimal diverticular change of the colon is noted. No findings to suggest diverticulitis are seen. The appendix is within normal limits. Small bowel is unremarkable. The stomach shows a small hiatal hernia. Vascular/Lymphatic: Aortic atherosclerosis. No enlarged abdominal or pelvic lymph nodes. Reproductive: Status post hysterectomy. No adnexal masses. Other: No abdominal wall hernia or abnormality. No abdominopelvic ascites. Musculoskeletal: Degenerative changes of lumbar spine are noted. IMPRESSION: 8.8 mm mid left ureteral stone causing obstructive change and significant Peri nephric fluid. The perinephric fluid may be in part due to a ruptured calyx although no extravasation of contrast is noted on delayed images. Some enhancement of the ureteral wall and collecting system wall is seen suggestive of underlying UTI. Correlation with laboratory values is recommended. Hepatic cysts. Mild left basilar atelectasis. Diverticulosis without diverticulitis. Electronically Signed   By: Alcide Clever M.D.   On: 04/08/2021 18:33    Procedures Procedures   Medications Ordered in ED Medications  sodium chloride 0.9 % bolus 1,000 mL (0 mLs Intravenous Stopped 04/08/21 2000)  morphine 4 MG/ML injection 4 mg (2 mg Intravenous Given 04/08/21 1728)  iohexol (OMNIPAQUE) 350  MG/ML injection 60 mL (60 mLs Intravenous Contrast Given 04/08/21 1749)  fentaNYL (SUBLIMAZE) injection 25 mcg (  25 mcg Intravenous Given 04/08/21 1841)  fentaNYL (SUBLIMAZE) injection 50 mcg (50 mcg Intravenous Given 04/08/21 1924)    ED Course  I have reviewed the triage vital signs and the nursing notes.  Pertinent labs & imaging results that were available during my care of the patient were reviewed by me and considered in my medical decision making (see chart for details).    MDM Rules/Calculators/A&P                           75 year old female who presents to the ED today for left lower quadrant pain for the past week with associated constipation.  Has had 2 doses of Cipro and Flagyl for diverticulitis diagnosed by PCP yesterday.  On arrival to the ED today patient's temperature is slightly elevated 99.2.  She is nontachycardic.  She is nontachypneic.  Blood pressure stable.  On my exam she has diffuse abdominal tenderness palpation without rebound or guarding.  She has significant tenderness palpation mostly to the left lower quadrant however.  She did have lab work done while in the ED today and these are compared to lab work done by PCPs office yesterday.  She does have a leukocytosis today 14,300 left shift.  Her hemoglobin is 11.8.  It was 12.3 yesterday.  She again has not had a bowel movement and denies any bright red blood per rectum.  I suspect her hemoglobin is slightly diminished given she had blood work drawn yesterday and then blood work drawn today.  Her CMP does show creatinine 1.35 however appears to be baseline for patient. Sodium potassium, and chloride all slightly diminished likely s/2 dehydration. Will plan for fluid resuscitation. Pending CT at this time.   CT: IMPRESSION:  8.8 mm mid left ureteral stone causing obstructive change and  significant Peri nephric fluid. The perinephric fluid may be in part  due to a ruptured calyx although no extravasation of contrast is   noted on delayed images. Some enhancement of the ureteral wall and  collecting system wall is seen suggestive of underlying UTI.  Correlation with laboratory values is recommended.     Hepatic cysts.     Mild left basilar atelectasis.     Diverticulosis without diverticulitis.   Discussed case with Dr. Benancio Deeds with urology.  Given her urine is noninfectious she does not need emergent stone however it depends on her pain control at this time.  If patient's pain unable to be controlled then recommends medicine admission with plans for likely stenting in the morning.  If patient's pain is able to be controlled she can be seen in the office tomorrow.   Patient continues to have pain after 2 mg of morphine and 25 mcg of fentanyl.  Additional 50 mcg Antonello provided.  Patient reports improvement in her pain.  I have discussed options with her.  She would rather go home at this time.  She states that she has been dealing with the pain for 6 days and feels like she can go another night.  Was planning on sending her home with pain medication and sending to 24-hour pharmacy however she does not want to drive at nighttime.  She states that she will deal with the pain at home and call urology tomorrow.  She is recommended to not eat or drink anything past midnight tonight in case she requires stenting in the morning.  She is advised to stop taking the Cipro and Flagyl prescribed to her  by PCP yesterday as she does not have diverticulitis or other infection.  She is in agreement with plan and stable for discharge.   This note was prepared using Dragon voice recognition software and may include unintentional dictation errors due to the inherent limitations of voice recognition software.  Final Clinical Impression(s) / ED Diagnoses Final diagnoses:  Ureteral stone with hydronephrosis    Rx / DC Orders ED Discharge Orders          Ordered    oxyCODONE-acetaminophen (PERCOCET/ROXICET) 5-325 MG tablet   Every 6 hours PRN        04/08/21 2014             Discharge Instructions      Please call [facility] at 8 AM tomorrow morning to schedule an appointment. Dr Benancio Deeds is aware that you will be calling and they will get you an appointment to be seen.   They recommend you do not eat or drink anything passed midnight tonight incase you need to go to the OR tomorrow for stent placement  I have sent in a short course of pain medication for you to take as needed.   Return to the ED for any new/worsening symptoms       Tanda Rockers, Cordelia Poche 04/08/21 2018    Gwyneth Sprout, MD 04/09/21 1340

## 2021-04-08 NOTE — ED Notes (Signed)
Pt to CT at this time.

## 2021-04-08 NOTE — ED Provider Notes (Signed)
Emergency Medicine Provider Triage Evaluation Note  Brooke Combs , a 75 y.o. female  was evaluated in triage.  Pt complains of abdominal distention, left lower quadrant pain, and constipation x1 week.  Patient seen by her PCP yesterday and was started on medication for diverticulitis.  Patient was post have CT scan in outpatient setting today however was unable to be obtained due to problem with insurance.  Patient reports history of abdominal surgery as a child to remove swallowed foreign body.  Review of Systems  Positive: Left lower quadrant abdominal pain, abdominal distention, constipation Negative: Fever, chills, nausea, vomiting, diarrhea, blood in stool, melena  Physical Exam  BP (!) 158/67 (BP Location: Left Arm)   Pulse 92   Temp 99.2 F (37.3 C) (Oral)   Resp (!) 21   SpO2 94%  Gen:   Awake, no distress   Resp:  Normal effort  MSK:   Moves extremities without difficulty  Other:  Abdomen soft, nondistended, tenderness to left lower quadrant.  Medical Decision Making  Medically screening exam initiated at 1:23 PM.  Appropriate orders placed.  MARYLN EASTHAM was informed that the remainder of the evaluation will be completed by another provider, this initial triage assessment does not replace that evaluation, and the importance of remaining in the ED until their evaluation is complete.  The patient appears stable so that the remainder of the work up may be completed by another provider.      Haskel Schroeder, PA-C 04/08/21 1325    Margarita Grizzle, MD 04/13/21 [phone]

## 2021-04-08 NOTE — ED Notes (Signed)
Pt advises being unable to void at this time, will monitor. °

## 2021-04-08 NOTE — Discharge Instructions (Addendum)
Please call [facility] at 8 AM tomorrow morning to schedule an appointment. Dr Benancio Deeds is aware that you will be calling and they will get you an appointment to be seen.   They recommend you do not eat or drink anything passed midnight tonight incase you need to go to the OR tomorrow for stent placement  I have sent in a short course of pain medication for you to take as needed.   Return to the ED for any new/worsening symptoms

## 2021-04-08 NOTE — ED Triage Notes (Signed)
Pt reports LLQ pain for a few days. She has not had a bowel movement in a few days. She reports seeing her PCP and getting antibiotics for diverticulitis, but the pain is worse. Denies N/V/D.

## 2021-04-08 NOTE — Telephone Encounter (Signed)
Please advise 

## 2021-04-08 NOTE — Telephone Encounter (Signed)
Taryn from [facility] called to ask to speak to Dr. Claris Che medical assistant. Benna Dunks stated that there was a stat referral that was put in for patient but the insurance did not approve it.  Benna Dunks states that the patient is in a lot of pain and is needing to be seen. She is needing someone to call and give patient instructions on what to do.  Please advise.

## 2021-04-09 ENCOUNTER — Other Ambulatory Visit: Payer: Self-pay | Admitting: Urology

## 2021-04-09 DIAGNOSIS — N201 Calculus of ureter: Secondary | ICD-10-CM | POA: Diagnosis not present

## 2021-04-09 DIAGNOSIS — R1084 Generalized abdominal pain: Secondary | ICD-10-CM | POA: Diagnosis not present

## 2021-04-09 DIAGNOSIS — N132 Hydronephrosis with renal and ureteral calculous obstruction: Secondary | ICD-10-CM | POA: Diagnosis not present

## 2021-04-13 ENCOUNTER — Encounter (HOSPITAL_BASED_OUTPATIENT_CLINIC_OR_DEPARTMENT_OTHER): Payer: Self-pay | Admitting: Urology

## 2021-04-13 NOTE — Progress Notes (Signed)
Talked with patient . Hx and meds reviewed. Stopped ASA on 04/09/21. Arrival time 74. May take am meds with a sip of water. Driver secured.

## 2021-04-15 ENCOUNTER — Ambulatory Visit (HOSPITAL_COMMUNITY): Payer: Medicare HMO

## 2021-04-15 ENCOUNTER — Encounter (HOSPITAL_COMMUNITY): Payer: Self-pay | Admitting: Urology

## 2021-04-15 ENCOUNTER — Encounter (HOSPITAL_BASED_OUTPATIENT_CLINIC_OR_DEPARTMENT_OTHER): Payer: Self-pay | Admitting: Urology

## 2021-04-15 ENCOUNTER — Ambulatory Visit (HOSPITAL_BASED_OUTPATIENT_CLINIC_OR_DEPARTMENT_OTHER)
Admission: RE | Admit: 2021-04-15 | Discharge: 2021-04-15 | Disposition: A | Discharge status: Home / Self Care | Payer: Medicare HMO | Attending: Urology | Admitting: Urology

## 2021-04-15 ENCOUNTER — Other Ambulatory Visit: Payer: Self-pay

## 2021-04-15 ENCOUNTER — Other Ambulatory Visit: Payer: Self-pay | Admitting: Urology

## 2021-04-15 ENCOUNTER — Encounter (HOSPITAL_BASED_OUTPATIENT_CLINIC_OR_DEPARTMENT_OTHER)
Admission: RE | Disposition: A | Discharge status: Home / Self Care | Payer: Self-pay | Source: Home / Self Care | Attending: Urology

## 2021-04-15 DIAGNOSIS — Z885 Allergy status to narcotic agent status: Secondary | ICD-10-CM | POA: Insufficient documentation

## 2021-04-15 DIAGNOSIS — N132 Hydronephrosis with renal and ureteral calculous obstruction: Secondary | ICD-10-CM | POA: Diagnosis not present

## 2021-04-15 DIAGNOSIS — N201 Calculus of ureter: Secondary | ICD-10-CM

## 2021-04-15 DIAGNOSIS — I878 Other specified disorders of veins: Secondary | ICD-10-CM | POA: Diagnosis not present

## 2021-04-15 DIAGNOSIS — K7689 Other specified diseases of liver: Secondary | ICD-10-CM | POA: Insufficient documentation

## 2021-04-15 DIAGNOSIS — Z87891 Personal history of nicotine dependence: Secondary | ICD-10-CM | POA: Insufficient documentation

## 2021-04-15 DIAGNOSIS — Z888 Allergy status to other drugs, medicaments and biological substances status: Secondary | ICD-10-CM | POA: Diagnosis not present

## 2021-04-15 DIAGNOSIS — J449 Chronic obstructive pulmonary disease, unspecified: Secondary | ICD-10-CM | POA: Diagnosis not present

## 2021-04-15 DIAGNOSIS — Z01818 Encounter for other preprocedural examination: Secondary | ICD-10-CM | POA: Diagnosis not present

## 2021-04-15 HISTORY — DX: Gastro-esophageal reflux disease without esophagitis: K21.9

## 2021-04-15 HISTORY — DX: Chronic obstructive pulmonary disease, unspecified: J44.9

## 2021-04-15 HISTORY — PX: EXTRACORPOREAL SHOCK WAVE LITHOTRIPSY: SHX1557

## 2021-04-15 SURGERY — LITHOTRIPSY, ESWL
Anesthesia: LOCAL | Laterality: Left

## 2021-04-15 MED ORDER — CIPROFLOXACIN HCL 500 MG PO TABS
500.0000 mg | ORAL_TABLET | ORAL | Status: AC
  Administered 2021-04-15: 500 mg via ORAL

## 2021-04-15 MED ORDER — DIAZEPAM 5 MG PO TABS
ORAL_TABLET | ORAL | Status: AC
  Filled 2021-04-15: qty 2

## 2021-04-15 MED ORDER — DIAZEPAM 5 MG PO TABS
10.0000 mg | ORAL_TABLET | ORAL | Status: AC
  Administered 2021-04-15: 10 mg via ORAL

## 2021-04-15 MED ORDER — CIPROFLOXACIN HCL 500 MG PO TABS
ORAL_TABLET | ORAL | Status: AC
  Filled 2021-04-15: qty 1

## 2021-04-15 MED ORDER — DIPHENHYDRAMINE HCL 25 MG PO CAPS
25.0000 mg | ORAL_CAPSULE | ORAL | Status: AC
  Administered 2021-04-15: 25 mg via ORAL

## 2021-04-15 MED ORDER — SODIUM CHLORIDE 0.9 % IV SOLN: INTRAVENOUS | Status: DC

## 2021-04-15 MED ORDER — DIPHENHYDRAMINE HCL 25 MG PO CAPS
ORAL_CAPSULE | ORAL | Status: AC
  Filled 2021-04-15: qty 1

## 2021-04-15 NOTE — Progress Notes (Signed)
For Short Stay: COVID SWAB appointment date: N/A Date of COVID positive in last 90 days: No   For Anesthesia: PCP - Nelwyn Salisbury, MD Cardiologist - N/A  Chest x-ray - 01/12/21 in epic EKG - N/A Stress Test - greater than 2 years  ECHO - N/A Cardiac Cath - N/A Pacemaker/ICD device last checked: N/A  Sleep Study - N/A CPAP - N/A  Fasting Blood Sugar - N/A Checks Blood Sugar __N/A___ times a day  Blood Thinner Instructions: N/A Aspirin Instructions: Yes Last Dose: 04/09/21  Activity level: Can go up a flight of stairs and activities of daily living without stopping and without chest pain and/or shortness of breath    Anesthesia review: COPD  Patient denies shortness of breath, fever, cough and chest pain at PAT appointment   Patient verbalized understanding of instructions that were given to them at the PAT appointment. Patient was also instructed that they will need to review over the PAT instructions again at home before surgery.

## 2021-04-15 NOTE — H&P (Signed)
See scanned [facility] documents for H&P.   

## 2021-04-15 NOTE — Progress Notes (Signed)
Stone could not be identified on plain films or via fluoroscopy.  ESWL cancelled.  Will arrange cystoscopy with left ureteroscopy, laser lithotripsy and stent placement tomorrow.    The risks, benefits and alternatives of cystoscopy with LEFT ureteroscopy, laser lithotripsy and ureteral stent placement was discussed the patient.  Risks included, but are not limited to: bleeding, urinary tract infection, ureteral injury/avulsion, ureteral stricture formation, retained stone fragments, the possibility that multiple surgeries may be required to treat the stone(s), MI, stroke, PE and the inherent risks of general anesthesia.  The patient voices understanding and wishes to proceed.

## 2021-04-16 ENCOUNTER — Ambulatory Visit (HOSPITAL_COMMUNITY): Payer: Medicare HMO | Admitting: Certified Registered"

## 2021-04-16 ENCOUNTER — Encounter (HOSPITAL_COMMUNITY)
Admission: RE | Disposition: A | Discharge status: Home / Self Care | Payer: Self-pay | Source: Home / Self Care | Attending: Urology

## 2021-04-16 ENCOUNTER — Ambulatory Visit (HOSPITAL_COMMUNITY): Payer: Medicare HMO

## 2021-04-16 ENCOUNTER — Encounter (HOSPITAL_BASED_OUTPATIENT_CLINIC_OR_DEPARTMENT_OTHER): Payer: Self-pay | Admitting: Urology

## 2021-04-16 ENCOUNTER — Ambulatory Visit (HOSPITAL_COMMUNITY)
Admission: RE | Admit: 2021-04-16 | Discharge: 2021-04-16 | Disposition: A | Discharge status: Home / Self Care | Payer: Medicare HMO | Attending: Urology | Admitting: Urology

## 2021-04-16 DIAGNOSIS — E039 Hypothyroidism, unspecified: Secondary | ICD-10-CM | POA: Insufficient documentation

## 2021-04-16 DIAGNOSIS — Z885 Allergy status to narcotic agent status: Secondary | ICD-10-CM | POA: Diagnosis not present

## 2021-04-16 DIAGNOSIS — J449 Chronic obstructive pulmonary disease, unspecified: Secondary | ICD-10-CM | POA: Diagnosis not present

## 2021-04-16 DIAGNOSIS — K219 Gastro-esophageal reflux disease without esophagitis: Secondary | ICD-10-CM | POA: Insufficient documentation

## 2021-04-16 DIAGNOSIS — Z888 Allergy status to other drugs, medicaments and biological substances status: Secondary | ICD-10-CM | POA: Insufficient documentation

## 2021-04-16 DIAGNOSIS — N132 Hydronephrosis with renal and ureteral calculous obstruction: Secondary | ICD-10-CM | POA: Diagnosis not present

## 2021-04-16 DIAGNOSIS — Z20822 Contact with and (suspected) exposure to covid-19: Secondary | ICD-10-CM | POA: Insufficient documentation

## 2021-04-16 DIAGNOSIS — E785 Hyperlipidemia, unspecified: Secondary | ICD-10-CM | POA: Diagnosis not present

## 2021-04-16 DIAGNOSIS — Z87891 Personal history of nicotine dependence: Secondary | ICD-10-CM | POA: Diagnosis not present

## 2021-04-16 DIAGNOSIS — E559 Vitamin D deficiency, unspecified: Secondary | ICD-10-CM | POA: Diagnosis not present

## 2021-04-16 HISTORY — PX: CYSTOSCOPY/URETEROSCOPY/HOLMIUM LASER/STENT PLACEMENT: SHX6546

## 2021-04-16 SURGERY — CYSTOSCOPY/URETEROSCOPY/HOLMIUM LASER/STENT PLACEMENT
Anesthesia: General | Laterality: Left

## 2021-04-16 MED ORDER — FENTANYL CITRATE (PF) 100 MCG/2ML IJ SOLN
INTRAMUSCULAR | Status: DC | PRN
  Administered 2021-04-16 (×2): 50 ug via INTRAVENOUS
  Administered 2021-04-16: 25 ug via INTRAVENOUS

## 2021-04-16 MED ORDER — LACTATED RINGERS IV SOLN: INTRAVENOUS | Status: DC

## 2021-04-16 MED ORDER — CEPHALEXIN 500 MG PO CAPS: 500.0000 mg | ORAL_CAPSULE | Freq: Two times a day (BID) | ORAL | 0 refills | Status: AC

## 2021-04-16 MED ORDER — LIDOCAINE 2% (20 MG/ML) 5 ML SYRINGE
INTRAMUSCULAR | Status: DC | PRN
  Administered 2021-04-16: 50 mg via INTRAVENOUS
  Administered 2021-04-16: 40 mg via INTRAVENOUS

## 2021-04-16 MED ORDER — IOHEXOL 300 MG/ML  SOLN
INTRAMUSCULAR | Status: DC | PRN
  Administered 2021-04-16: 13 mL

## 2021-04-16 MED ORDER — DEXAMETHASONE SODIUM PHOSPHATE 10 MG/ML IJ SOLN
INTRAMUSCULAR | Status: DC | PRN
  Administered 2021-04-16: 4 mg via INTRAVENOUS

## 2021-04-16 MED ORDER — FENTANYL CITRATE (PF) 100 MCG/2ML IJ SOLN
INTRAMUSCULAR | Status: AC
  Filled 2021-04-16: qty 2

## 2021-04-16 MED ORDER — SODIUM CHLORIDE 0.9 % IR SOLN
Status: DC | PRN
  Administered 2021-04-16: 3000 mL via INTRAVESICAL

## 2021-04-16 MED ORDER — ONDANSETRON HCL 4 MG/2ML IJ SOLN
INTRAMUSCULAR | Status: AC
  Filled 2021-04-16: qty 2

## 2021-04-16 MED ORDER — LIDOCAINE HCL (PF) 2 % IJ SOLN
INTRAMUSCULAR | Status: AC
  Filled 2021-04-16: qty 5

## 2021-04-16 MED ORDER — ACETAMINOPHEN 10 MG/ML IV SOLN: 1000.0000 mg | Freq: Once | INTRAVENOUS | Status: DC | PRN

## 2021-04-16 MED ORDER — CEFAZOLIN SODIUM-DEXTROSE 2-4 GM/100ML-% IV SOLN
INTRAVENOUS | Status: AC
  Filled 2021-04-16: qty 100

## 2021-04-16 MED ORDER — DEXAMETHASONE SODIUM PHOSPHATE 10 MG/ML IJ SOLN
INTRAMUSCULAR | Status: AC
  Filled 2021-04-16: qty 1

## 2021-04-16 MED ORDER — FENTANYL CITRATE PF 50 MCG/ML IJ SOSY: 25.0000 ug | PREFILLED_SYRINGE | INTRAMUSCULAR | Status: DC | PRN

## 2021-04-16 MED ORDER — ONDANSETRON HCL 4 MG/2ML IJ SOLN
INTRAMUSCULAR | Status: DC | PRN
  Administered 2021-04-16: 4 mg via INTRAVENOUS

## 2021-04-16 MED ORDER — PROPOFOL 10 MG/ML IV BOLUS
INTRAVENOUS | Status: AC
  Filled 2021-04-16: qty 20

## 2021-04-16 MED ORDER — CHLORHEXIDINE GLUCONATE 0.12 % MT SOLN
15.0000 mL | Freq: Once | OROMUCOSAL | Status: AC
  Administered 2021-04-16: 15 mL via OROMUCOSAL

## 2021-04-16 MED ORDER — HYDROCODONE-ACETAMINOPHEN 5-325 MG PO TABS: 1.0000 | ORAL_TABLET | ORAL | 0 refills | Status: DC | PRN

## 2021-04-16 MED ORDER — CEFAZOLIN SODIUM-DEXTROSE 2-4 GM/100ML-% IV SOLN
2.0000 g | Freq: Once | INTRAVENOUS | Status: AC
  Administered 2021-04-16: 2 g via INTRAVENOUS

## 2021-04-16 MED ORDER — PROPOFOL 10 MG/ML IV BOLUS
INTRAVENOUS | Status: DC | PRN
  Administered 2021-04-16: 110 mg via INTRAVENOUS
  Administered 2021-04-16: 30 mg via INTRAVENOUS

## 2021-04-16 SURGICAL SUPPLY — 21 items
BAG URO CATCHER STRL LF (MISCELLANEOUS) ×2 IMPLANT
BASKET ZERO TIP NITINOL 2.4FR (BASKET) ×1 IMPLANT
BSKT STON RTRVL ZERO TP 2.4FR (BASKET) ×1
CATH URET 5FR 28IN OPEN ENDED (CATHETERS) ×2 IMPLANT
CLOTH BEACON ORANGE TIMEOUT ST (SAFETY) ×2 IMPLANT
EXTRACTOR STONE NITINOL NGAGE (UROLOGICAL SUPPLIES) IMPLANT
GLOVE SURG ENC TEXT LTX SZ7.5 (GLOVE) ×2 IMPLANT
GOWN STRL REUS W/TWL XL LVL3 (GOWN DISPOSABLE) ×2 IMPLANT
GUIDEWIRE STR DUAL SENSOR (WIRE) IMPLANT
GUIDEWIRE ZIPWRE .038 STRAIGHT (WIRE) ×3 IMPLANT
KIT TURNOVER KIT A (KITS) ×2 IMPLANT
LASER FIB FLEXIVA PULSE ID 365 (Laser) IMPLANT
MANIFOLD NEPTUNE II (INSTRUMENTS) ×2 IMPLANT
PACK CYSTO (CUSTOM PROCEDURE TRAY) ×2 IMPLANT
SHEATH URETERAL 12FRX35CM (MISCELLANEOUS) IMPLANT
STENT URET 6FRX24 CONTOUR (STENTS) ×1 IMPLANT
STENT URET 6FRX26 CONTOUR (STENTS) IMPLANT
TRACTIP FLEXIVA PULS ID 200XHI (Laser) IMPLANT
TRACTIP FLEXIVA PULSE ID 200 (Laser) ×2
TUBING CONNECTING 10 (TUBING) ×2 IMPLANT
TUBING UROLOGY SET (TUBING) ×2 IMPLANT

## 2021-04-16 NOTE — Op Note (Signed)
Operative Note  Preoperative diagnosis:  1.  8 mm left mid ureteral calculus  Postoperative diagnosis: 1.  8 mm left mid ureteral calculus  Procedure(s): 1.  Cystoscopy with left ureteroscopy, holmium laser lithotripsy and left JJ stent placement 2.  Left retrograde pyelogram with intraoperative interpretation of fluoroscopic imaging  Surgeon: Rhoderick Moody, MD  Assistants:  None  Anesthesia:  General  Complications:  None  EBL: Less than 5 mL  Specimens: 1.  Left ureteral stone fragments  Drains/Catheters: 1.  Left 6 French, 24 cm JJ stent without tether  Intraoperative findings:   Left retrograde pyelogram revealed a filling defect within the mid to distal aspects of the left ureter, consistent with her obstructing stone seen on recent cross-sectional imaging.  The left ureter was uniformly dilated with no other filling defects.  The left renal pelvis was also mildly dilated with no identifiable filling defects.  Indication:  Brooke Combs is a 75 y.o. female with an obstructing 8 mm left mid ureteral calculus associated with moderate to severe hydronephrosis.  We attempted to proceed with ESWL, but the stone could not be identified radiographically.  She is here today for ureteroscopy to address her stone burden.  She has been consented for the above procedures, voiced understanding wishes to proceed.  Description of procedure:  After informed consent was obtained, the patient was brought to the operating room and general LMA anesthesia was administered. The patient was then placed in the dorsolithotomy position and prepped and draped in the usual sterile fashion. A timeout was performed. A 23 French rigid cystoscope was then inserted into the urethral meatus and advanced into the bladder under direct vision. A complete bladder survey revealed no intravesical pathology.  A 5 French ureteral catheter was then inserted into the left ureteral orifice and a retrograde  pyelogram was obtained, with the findings listed above.  A Glidewire was then used to intubate the lumen of the ureteral catheter and was advanced up to the left renal pelvis, under fluoroscopic guidance.  The catheter was then removed, leaving the wire in place.  A semirigid ureteroscope was then advanced into the left ureteral orifice and up to the level of the mid ureter where an obstructing stone was identified.  A 200 m holmium laser was then used to fracture the stone into several smaller pieces.  A 0 tip basket was then used to extract all stone fragments from the lumen of the left ureter.  The semirigid ureteroscope was then exchanged for a rigid cystoscope, which was advanced over the wire and into the bladder.  A 6 French, 24 cm JJ stent was then advanced over the wire and into good position within the left collecting system, confirming placement via fluoroscopy.  The patient's bladder was drained and all stone fragments were evacuated.  She tolerated the procedure well and was transferred to the postanesthesia in stable condition.  Plan: Follow-up in 7 days for office cystoscopy and stent removal

## 2021-04-16 NOTE — Anesthesia Postprocedure Evaluation (Signed)
Anesthesia Post Note  Patient: Brooke Combs  Procedure(s) Performed: CYSTOSCOPY/RETROGRADE/URETEROSCOPY/HOLMIUM LASER/STENT PLACEMENT (Left)     Patient location during evaluation: PACU Anesthesia Type: General Level of consciousness: awake and alert Pain management: pain level controlled Vital Signs Assessment: post-procedure vital signs reviewed and stable Respiratory status: spontaneous breathing, nonlabored ventilation, respiratory function stable and patient connected to nasal cannula oxygen Cardiovascular status: blood pressure returned to baseline and stable Postop Assessment: no apparent nausea or vomiting Anesthetic complications: no   No notable events documented.  Last Vitals:  Vitals:   04/16/21 1515 04/16/21 1529  BP: (!) 146/74 (!) 141/62  Pulse: 73 77  Resp: (!) 23 20  Temp:    SpO2: 97% 96%    Last Pain:  Vitals:   04/16/21 1513  TempSrc:   PainSc: 0-No pain                 Izayah Miner L Adonte Vanriper

## 2021-04-16 NOTE — Anesthesia Preprocedure Evaluation (Signed)
Anesthesia Evaluation  Patient identified by MRN, date of birth, ID band Patient awake    Airway Mallampati: II  TM Distance: >3 FB Neck ROM: Full    Dental  (+) Teeth Intact   Pulmonary COPD,  COPD inhaler, former smoker,    Pulmonary exam normal        Cardiovascular negative cardio ROS   Rhythm:Regular Rate:Normal     Neuro/Psych Anxiety negative neurological ROS     GI/Hepatic Neg liver ROS, GERD  Medicated,  Endo/Other  Hypothyroidism   Renal/GU Left ureteral calculi   negative genitourinary   Musculoskeletal negative musculoskeletal ROS (+)   Abdominal (+)  Abdomen: soft.    Peds  Hematology negative hematology ROS (+)   Anesthesia Other Findings   Reproductive/Obstetrics                             Anesthesia Physical Anesthesia Plan  ASA: 2  Anesthesia Plan: General   Post-op Pain Management:    Induction: Intravenous  PONV Risk Score and Plan: 3 and Ondansetron, Dexamethasone and Treatment may vary due to age or medical condition  Airway Management Planned: Mask and LMA  Additional Equipment: None  Intra-op Plan:   Post-operative Plan: Extubation in OR  Informed Consent: I have reviewed the patients History and Physical, chart, labs and discussed the procedure including the risks, benefits and alternatives for the proposed anesthesia with the patient or authorized representative who has indicated his/her understanding and acceptance.     Dental advisory given  Plan Discussed with: CRNA  Anesthesia Plan Comments: (Lab Results      Component                Value               Date                      WBC                      14.3 (H)            04/08/2021                HGB                      11.8 (L)            04/08/2021                HCT                      35.5 (L)            04/08/2021                MCV                      92.9                 04/08/2021                PLT                      312                 04/08/2021           Lab Results  Component                Value               Date                      NA                       134 (L)             04/08/2021                K                        3.4 (L)             04/08/2021                CO2                      28                  04/08/2021                GLUCOSE                  112 (H)             04/08/2021                BUN                      27 (H)              04/08/2021                CREATININE               1.35 (H)            04/08/2021                CALCIUM                  8.8 (L)             04/08/2021                GFRNONAA                 41 (L)              04/08/2021          )        Anesthesia Quick Evaluation

## 2021-04-16 NOTE — H&P (Deleted)
  The note originally documented on this encounter has been moved the the encounter in which it belongs.  

## 2021-04-16 NOTE — H&P (Signed)
Urology Preoperative H&P   Chief Complaint: Left flank pain  History of Present Illness: Brooke Combs is a 75 y.o. female with an obstructing 8 mm left mid ureteral calculus seen on CT from 04/08/2021.  We attempted to perform ESWL on the stone yesterday, but could not identified on plain film or with fluoroscopy.  She continues to have intermittent episodes of sharp left-sided flank pain, but denies nausea/vomiting, fever/chills, dysuria or hematuria.  She has not noticed a stone pass in her urine over the last several days.  Recent urine culture in the office was negative.  She is here today for ureteroscopy for definitive stone treatment.  Past Medical History:  Diagnosis Date   Anxiety    COPD (chronic obstructive pulmonary disease) (HCC)    GERD (gastroesophageal reflux disease)    History of Graves' disease    had radioactive iodine ablation    Hyperlipidemia    Thyroid disease    hypothyroidism    Past Surgical History:  Procedure Laterality Date   EXPLORATORY LAPAROTOMY     age 5, foreign body removal   EXTRACORPOREAL SHOCK WAVE LITHOTRIPSY Left 04/15/2021   Procedure: EXTRACORPOREAL SHOCK WAVE LITHOTRIPSY (ESWL);  Surgeon: Rene Paci, MD;  Location: [facility];  Service: Urology;  Laterality: Left;   VAGINAL HYSTERECTOMY      Allergies:  Allergies  Allergen Reactions   Celecoxib Other (See Comments)    Fatigue   Estrogens Other (See Comments)    Blurry vision   Tramadol Other (See Comments)    constipation   Etodolac Rash    Ants crawling in hands /blister in hand and right arm Lodine    Family History  Problem Relation Age of Onset   Lupus Sister    Hypertension Sister     Social History:  reports that she has quit smoking. Her smoking use included cigarettes. She has never used smokeless tobacco. She reports that she does not drink alcohol and does not use drugs.  ROS: A complete review of systems was performed.  All  systems are negative except for pertinent findings as noted.  Physical Exam:  Vital signs in last 24 hours: Temp:  [98.1 F (36.7 C)] 98.1 F (36.7 C) (09/30 1159) Pulse Rate:  [97] 97 (09/30 1159) Resp:  [20] 20 (09/30 1159) BP: (143)/(89) 143/89 (09/30 1159) SpO2:  [99 %] 99 % (09/30 1159) Weight:  [50.8 kg] 50.8 kg (09/30 1204) Constitutional:  Alert and oriented, No acute distress Cardiovascular: Regular rate and rhythm, No JVD Respiratory: Normal respiratory effort, Lungs clear bilaterally GI: Abdomen is soft, nontender, nondistended, no abdominal masses GU: No CVA tenderness Lymphatic: No lymphadenopathy Neurologic: Grossly intact, no focal deficits Psychiatric: Normal mood and affect  Laboratory Data:  No results for input(s): WBC, HGB, HCT, PLT in the last 72 hours.  No results for input(s): NA, K, CL, GLUCOSE, BUN, CALCIUM, CREATININE in the last 72 hours.  Invalid input(s): CO3   No results found for this or any previous visit (from the past 24 hour(s)). Recent Results (from the past 240 hour(s))  Resp Panel by RT-PCR (Flu A&B, Covid) Nasopharyngeal Swab     Status: None   Collection Time: 04/08/21  6:41 PM   Specimen: Nasopharyngeal Swab; Nasopharyngeal(NP) swabs in vial transport medium  Result Value Ref Range Status   SARS Coronavirus 2 by RT PCR NEGATIVE NEGATIVE Final    Comment: (NOTE) SARS-CoV-2 target nucleic acids are NOT DETECTED.  The SARS-CoV-2 RNA is generally  detectable in upper respiratory specimens during the acute phase of infection. The lowest concentration of SARS-CoV-2 viral copies this assay can detect is 138 copies/mL. A negative result does not preclude SARS-Cov-2 infection and should not be used as the sole basis for treatment or other patient management decisions. A negative result may occur with  improper specimen collection/handling, submission of specimen other than nasopharyngeal swab, presence of viral mutation(s) within the areas  targeted by this assay, and inadequate number of viral copies(<138 copies/mL). A negative result must be combined with clinical observations, patient history, and epidemiological information. The expected result is Negative.  Fact Sheet for Patients:  BloggerCourse.com  Fact Sheet for Healthcare Providers:  SeriousBroker.it  This test is no t yet approved or cleared by the [place] FDA and  has been authorized for detection and/or diagnosis of SARS-CoV-2 by FDA under an Emergency Use Authorization (EUA). This EUA will remain  in effect (meaning this test can be used) for the duration of the COVID-19 declaration under Section 564(b)(1) of the Act, 21 U.S.C.section 360bbb-3(b)(1), unless the authorization is terminated  or revoked sooner.       Influenza A by PCR NEGATIVE NEGATIVE Final   Influenza B by PCR NEGATIVE NEGATIVE Final    Comment: (NOTE) The Xpert Xpress SARS-CoV-2/FLU/RSV plus assay is intended as an aid in the diagnosis of influenza from Nasopharyngeal swab specimens and should not be used as a sole basis for treatment. Nasal washings and aspirates are unacceptable for Xpert Xpress SARS-CoV-2/FLU/RSV testing.  Fact Sheet for Patients: BloggerCourse.com  Fact Sheet for Healthcare Providers: SeriousBroker.it  This test is not yet approved or cleared by the [place] FDA and has been authorized for detection and/or diagnosis of SARS-CoV-2 by FDA under an Emergency Use Authorization (EUA). This EUA will remain in effect (meaning this test can be used) for the duration of the COVID-19 declaration under Section 564(b)(1) of the Act, 21 U.S.C. section 360bbb-3(b)(1), unless the authorization is terminated or revoked.  Performed at [facility], [address], [place], [place] [address]     Renal Function: No results for input(s):  CREATININE in the last 168 hours. Estimated Creatinine Clearance: 29.8 mL/min (A) (by C-G formula based on SCr of 1.35 mg/dL (H)).  Radiologic Imaging: CLINICAL DATA: Left lower quadrant pain for a few days   EXAM:  CT ABDOMEN AND PELVIS WITH CONTRAST   TECHNIQUE:  Multidetector CT imaging of the abdomen and pelvis was performed  using the standard protocol following bolus administration of  intravenous contrast.   CONTRAST: 70mL OMNIPAQUE IOHEXOL 350 MG/ML SOLN   COMPARISON: None.   FINDINGS:  Lower chest: Mild left basilar atelectasis is noted. No sizable  effusion is seen. Calcified breast implants are noted bilaterally.   Hepatobiliary: Hepatic cysts are noted. The largest of these  measures up to 3.8 cm. Gallbladder is partially distended but within  normal limits.   Pancreas: Unremarkable. No pancreatic ductal dilatation or  surrounding inflammatory changes.   Spleen: Normal in size without focal abnormality.   Adrenals/Urinary Tract: Adrenal glands are within normal limits.  Right kidney demonstrates a normal enhancement pattern with normal  excretion on delayed images. No calculi are noted. The left kidney  demonstrates a normal enhancement pattern although hydronephrosis  and hydroureter is seen as well as a significant amount of  perinephric stranding and mild fluid extending in the inferior  aspect of the perirenal space. The free fluid in the perirenal space  may be  related to a ruptured calyx. Enhancement of the collecting  system wall and ureter on the left is seen which may be related to  underlying UTI. In the distal left ureter at the level of the sacrum  there is an 8.8 mm obstructing stone identified adjacent to the  common iliac artery bifurcation on the left. The more distal left  ureter appears within normal limits. The bladder is partially  distended.   Stomach/Bowel: Minimal diverticular change of the colon is noted. No  findings to suggest  diverticulitis are seen. The appendix is within  normal limits. Small bowel is unremarkable. The stomach shows a  small hiatal hernia.   Vascular/Lymphatic: Aortic atherosclerosis. No enlarged abdominal or  pelvic lymph nodes.   Reproductive: Status post hysterectomy. No adnexal masses.   Other: No abdominal wall hernia or abnormality. No abdominopelvic  ascites.   Musculoskeletal: Degenerative changes of lumbar spine are noted.   IMPRESSION:  8.8 mm mid left ureteral stone causing obstructive change and  significant Peri nephric fluid. The perinephric fluid may be in part  due to a ruptured calyx although no extravasation of contrast is  noted on delayed images. Some enhancement of the ureteral wall and  collecting system wall is seen suggestive of underlying UTI.  Correlation with laboratory values is recommended.   Hepatic cysts.   Mild left basilar atelectasis.   Diverticulosis without diverticulitis.    Electronically Signed  By: Alcide Clever M.D.  On: 04/08/2021 18:33  I independently reviewed the above imaging studies.  Assessment and Plan MCKENA CHERN is a 76 y.o. female with an obstructing 8 mm left mid ureteral calculus  The risks, benefits and alternatives of cystoscopy with LEFT ureteroscopy, laser lithotripsy and ureteral stent placement was discussed the patient.  Risks included, but are not limited to: bleeding, urinary tract infection, ureteral injury/avulsion, ureteral stricture formation, retained stone fragments, the possibility that multiple surgeries may be required to treat the stone(s), MI, stroke, PE and the inherent risks of general anesthesia.  The patient voices understanding and wishes to proceed.      Rhoderick Moody, MD 04/16/2021, 1:11 PM  [facility] Pager: [phone]

## 2021-04-16 NOTE — Transfer of Care (Signed)
Immediate Anesthesia Transfer of Care Note  Patient: Brooke Combs  Procedure(s) Performed: CYSTOSCOPY/RETROGRADE/URETEROSCOPY/HOLMIUM LASER/STENT PLACEMENT (Left)  Patient Location: PACU  Anesthesia Type:General  Level of Consciousness: drowsy  Airway & Oxygen Therapy: Patient Spontanous Breathing and Patient connected to face mask oxygen  Post-op Assessment: Report given to RN and Post -op Vital signs reviewed and stable  Post vital signs: Reviewed and stable  Last Vitals:  Vitals Value Taken Time  BP 126/59 04/16/21 1445  Temp    Pulse 73 04/16/21 1446  Resp 16 04/16/21 1446  SpO2 100 % 04/16/21 1446  Vitals shown include unvalidated device data.  Last Pain:  Vitals:   04/16/21 1204  TempSrc:   PainSc: 0-No pain         Complications: No notable events documented.

## 2021-04-16 NOTE — Anesthesia Procedure Notes (Signed)
Procedure Name: LMA Insertion Date/Time: 04/16/2021 2:00 PM Performed by: Sindy Guadeloupe, CRNA Pre-anesthesia Checklist: Patient identified, Emergency Drugs available, Suction available, Patient being monitored and Timeout performed Patient Re-evaluated:Patient Re-evaluated prior to induction Oxygen Delivery Method: Circle system utilized Preoxygenation: Pre-oxygenation with 100% oxygen Induction Type: IV induction Ventilation: Mask ventilation without difficulty LMA: LMA inserted and LMA with gastric port inserted LMA Size: 4.0 Number of attempts: 2 Tube secured with: Tape Dental Injury: Teeth and Oropharynx as per pre-operative assessment  Comments: Poor seal with regular LMA #4, replaced with gastric port LMA #4 with Vannary Greening seal.

## 2021-04-17 ENCOUNTER — Encounter (HOSPITAL_COMMUNITY): Payer: Self-pay | Admitting: Urology

## 2021-04-23 DIAGNOSIS — N201 Calculus of ureter: Secondary | ICD-10-CM | POA: Diagnosis not present

## 2021-04-23 DIAGNOSIS — N132 Hydronephrosis with renal and ureteral calculous obstruction: Secondary | ICD-10-CM | POA: Diagnosis not present

## 2021-05-10 ENCOUNTER — Other Ambulatory Visit: Payer: Self-pay | Admitting: Family Medicine

## 2021-05-13 ENCOUNTER — Telehealth: Payer: Self-pay | Admitting: Family Medicine

## 2021-05-13 NOTE — Telephone Encounter (Signed)
Left message for patient to call back and schedule Medicare Annual Wellness Visit (AWV) either virtually or in office. Left  my jabber number [phone]   awvi 07/18/13 per [facility]  please schedule at anytime with [facility] Nurse Health Advisor 1 or 2   This should be a 45 minute visit.  

## 2021-06-08 ENCOUNTER — Other Ambulatory Visit: Payer: Self-pay | Admitting: Family Medicine

## 2021-06-17 ENCOUNTER — Telehealth: Payer: Self-pay | Admitting: Family Medicine

## 2021-06-17 NOTE — Telephone Encounter (Signed)
Left message for patient to call back and schedule Medicare Annual Wellness Visit (AWV) either virtually or in office. Left  my jabber number [phone]   awvi 07/18/13 per [facility]  please schedule at anytime with [facility] Nurse Health Advisor 1 or 2   This should be a 45 minute visit.  

## 2021-07-11 ENCOUNTER — Other Ambulatory Visit: Payer: Self-pay | Admitting: Family Medicine

## 2021-07-14 ENCOUNTER — Other Ambulatory Visit: Payer: Self-pay | Admitting: Family Medicine

## 2021-07-14 NOTE — Telephone Encounter (Signed)
Pt LOV was on 04/07/2021 Last refill dane on 12/30/2020 Please advise

## 2021-08-10 ENCOUNTER — Telehealth: Payer: Self-pay | Admitting: Family Medicine

## 2021-08-10 NOTE — Telephone Encounter (Signed)
Spoke with patient to schedule Medicare Annual Wellness Visit (AWV) either virtually or in office.   She stated she had a lot going on call back in 09/2021   awvi 07/18/13 per [facility]   please schedule at anytime with [facility] Nurse Health Advisor 1 or 2   This should be a 45 minute visit.

## 2021-08-11 ENCOUNTER — Other Ambulatory Visit: Payer: Self-pay | Admitting: Family Medicine

## 2021-09-02 ENCOUNTER — Ambulatory Visit (INDEPENDENT_AMBULATORY_CARE_PROVIDER_SITE_OTHER): Payer: Medicare HMO | Admitting: Family Medicine

## 2021-09-02 ENCOUNTER — Encounter: Payer: Self-pay | Admitting: Family Medicine

## 2021-09-02 VITALS — BP 128/70 | HR 82 | Temp 98.5°F | Wt 111.0 lb

## 2021-09-02 DIAGNOSIS — G8929 Other chronic pain: Secondary | ICD-10-CM | POA: Diagnosis not present

## 2021-09-02 DIAGNOSIS — M5441 Lumbago with sciatica, right side: Secondary | ICD-10-CM

## 2021-09-02 DIAGNOSIS — M26651 Arthropathy of right temporomandibular joint: Secondary | ICD-10-CM

## 2021-09-02 DIAGNOSIS — M5442 Lumbago with sciatica, left side: Secondary | ICD-10-CM | POA: Diagnosis not present

## 2021-09-02 MED ORDER — OXYCODONE-ACETAMINOPHEN 5-325 MG PO TABS: 1.0000 | ORAL_TABLET | ORAL | 0 refills | Status: AC | PRN

## 2021-09-02 NOTE — Progress Notes (Signed)
6  Subjective:    Patient ID: Brooke Combs, female    DOB: 1945/10/01, 76 y.o.   MRN: [id number]  HPI Here for several issues. 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 Lately this has been giving her more trouble. She asks if there is anything she can do for this. Also she asks for another small supply of pain medication for when her back pain flares up    Review of Systems  Constitutional: Negative.   HENT:  Positive for dental problem.   Respiratory: Negative.    Cardiovascular: Negative.   Musculoskeletal:  Positive for back pain.      Objective:   Physical Exam Constitutional:      Appearance: Normal appearance.  HENT:     Mouth/Throat:     Comments: Tender over the right TMJ  Cardiovascular:     Rate and Rhythm: Normal rate.     Pulses: Normal pulses.     Heart sounds: Normal heart sounds.  Pulmonary:     Effort: Pulmonary effort is normal.     Breath sounds: Normal breath sounds.  Neurological:     Mental Status: She is alert.          Assessment & Plan:  For the TMJ pain, I again advised her to see her dentist so they can fashion a mouth guard for her to wear in bed at night. This can keep her alignment straight and take stress off the TMJ's. For the back pain, we wrote for #30 Percocet to use as needed. Gershon Crane, MD

## 2021-09-12 ENCOUNTER — Other Ambulatory Visit: Payer: Self-pay | Admitting: Family Medicine

## 2021-09-18 ENCOUNTER — Other Ambulatory Visit: Payer: Self-pay | Admitting: Family Medicine

## 2021-09-18 DIAGNOSIS — J40 Bronchitis, not specified as acute or chronic: Secondary | ICD-10-CM

## 2021-09-23 ENCOUNTER — Telehealth: Payer: Self-pay | Admitting: Family Medicine

## 2021-09-23 NOTE — Telephone Encounter (Signed)
Spoke with patient to  schedule Medicare Annual Wellness Visit (AWV) either virtually or in office.  ? ?Patient has a lot going on and wanted a call back in may  ? ?awvi 07/18/13 per [facility]  ? please schedule at anytime with [facility] Nurse Health Advisor 1 or 2 ? ? ?This should be a 45 minute visit.  ?

## 2021-10-25 ENCOUNTER — Encounter: Payer: Self-pay | Admitting: Family Medicine

## 2021-10-25 ENCOUNTER — Ambulatory Visit (INDEPENDENT_AMBULATORY_CARE_PROVIDER_SITE_OTHER): Payer: Medicare HMO | Admitting: Family Medicine

## 2021-10-25 VITALS — BP 142/80 | HR 108 | Temp 98.5°F | Ht 63.0 in | Wt 106.2 lb

## 2021-10-25 DIAGNOSIS — F418 Other specified anxiety disorders: Secondary | ICD-10-CM

## 2021-10-25 NOTE — Progress Notes (Signed)
? ?  Subjective:  ? ? Patient ID: Brooke Combs, female    DOB: Nov 18, 1945, 76 y.o.   MRN: [id number] ? ?HPI ?Here asking for advice about her granddaughter who has been dealing with chronic RUQ abdominal pain. She has been worked up with an abdominal US and upper and lower endoscopy. Nothing has shown up so far, and Brooke Combs is very worried about her.  ? ? ?Review of Systems  ?Constitutional: Negative.   ? ?   ?Objective:  ? Physical Exam ?Constitutional:   ?   Appearance: Normal appearance.  ?Neurological:  ?   Mental Status: She is alert.  ? ? ? ? ? ?   ?Assessment & Plan:  ?Concern over her granddaughter's health problems. I told her to have her granddaughter ask her PCP about getting a HIDA scan to evaluate her gall bladder function. There is no charge for our discussion today.  ?Gershon Crane, MD ? ? ?

## 2021-11-16 ENCOUNTER — Telehealth: Payer: Self-pay | Admitting: Family Medicine

## 2021-11-16 NOTE — Telephone Encounter (Signed)
Spoke to patient to schedule Medicare Annual Wellness Visit (AWV) either virtually or in office. She has a lot going on and wanted a call back in June  ? ? ?awvi 07/18/13 per [facility] ?please schedule at anytime with [facility] Nurse Health Advisor 1 or 2 ? ? ?This should be a 45 minute visit.  ?

## 2021-11-20 ENCOUNTER — Other Ambulatory Visit: Payer: Self-pay | Admitting: Family Medicine

## 2021-11-20 DIAGNOSIS — J40 Bronchitis, not specified as acute or chronic: Secondary | ICD-10-CM

## 2021-12-05 ENCOUNTER — Other Ambulatory Visit: Payer: Self-pay | Admitting: Family Medicine

## 2021-12-10 ENCOUNTER — Ambulatory Visit (INDEPENDENT_AMBULATORY_CARE_PROVIDER_SITE_OTHER): Payer: Medicare HMO | Admitting: Family Medicine

## 2021-12-10 ENCOUNTER — Encounter: Payer: Self-pay | Admitting: Family Medicine

## 2021-12-10 VITALS — BP 124/64 | HR 88 | Temp 98.3°F | Ht 63.0 in | Wt 107.8 lb

## 2021-12-10 DIAGNOSIS — G8929 Other chronic pain: Secondary | ICD-10-CM | POA: Diagnosis not present

## 2021-12-10 DIAGNOSIS — M26653 Arthropathy of bilateral temporomandibular joint: Secondary | ICD-10-CM

## 2021-12-10 DIAGNOSIS — E782 Mixed hyperlipidemia: Secondary | ICD-10-CM | POA: Diagnosis not present

## 2021-12-10 DIAGNOSIS — M5442 Lumbago with sciatica, left side: Secondary | ICD-10-CM

## 2021-12-10 DIAGNOSIS — R739 Hyperglycemia, unspecified: Secondary | ICD-10-CM | POA: Diagnosis not present

## 2021-12-10 DIAGNOSIS — E039 Hypothyroidism, unspecified: Secondary | ICD-10-CM

## 2021-12-10 DIAGNOSIS — M5441 Lumbago with sciatica, right side: Secondary | ICD-10-CM

## 2021-12-10 LAB — LIPID PANEL
Cholesterol: 189 mg/dL (ref 0–200)
HDL: 60.1 mg/dL (ref >39.00)
LDL Cholesterol: 113 mg/dL — ABNORMAL HIGH (ref 0–99)
NonHDL: 129.34
Total CHOL/HDL Ratio: 3
Triglycerides: 84 mg/dL (ref 0.0–149.0)
VLDL: 16.8 mg/dL (ref 0.0–40.0)

## 2021-12-10 LAB — BASIC METABOLIC PANEL
BUN: 19 mg/dL (ref 6–23)
CO2: 30 mEq/L (ref 19–32)
Calcium: 10 mg/dL (ref 8.4–10.5)
Chloride: 105 mEq/L (ref 96–112)
Creatinine, Ser: 0.91 mg/dL (ref 0.40–1.20)
GFR: 62.23 mL/min (ref >60.00)
Glucose, Bld: 103 mg/dL — ABNORMAL HIGH (ref 70–99)
Potassium: 5.5 mEq/L — ABNORMAL HIGH (ref 3.5–5.1)
Sodium: 141 mEq/L (ref 135–145)

## 2021-12-10 LAB — CBC WITH DIFFERENTIAL/PLATELET
Basophils Absolute: 0.1 10*3/uL (ref 0.0–0.1)
Basophils Relative: 0.7 % (ref 0.0–3.0)
Eosinophils Absolute: 0.2 10*3/uL (ref 0.0–0.7)
Eosinophils Relative: 2 % (ref 0.0–5.0)
HCT: 37.9 % (ref 36.0–46.0)
Hemoglobin: 12.7 g/dL (ref 12.0–15.0)
Lymphocytes Relative: 26.4 % (ref 12.0–46.0)
Lymphs Abs: 2.8 10*3/uL (ref 0.7–4.0)
MCHC: 33.5 g/dL (ref 30.0–36.0)
MCV: 94 fl (ref 78.0–100.0)
Monocytes Absolute: 0.8 10*3/uL (ref 0.1–1.0)
Monocytes Relative: 8 % (ref 3.0–12.0)
Neutro Abs: 6.7 10*3/uL (ref 1.4–7.7)
Neutrophils Relative %: 62.9 % (ref 43.0–77.0)
Platelets: 308 10*3/uL (ref 150.0–400.0)
RBC: 4.03 Mil/uL (ref 3.87–5.11)
RDW: 13 % (ref 11.5–15.5)
WBC: 10.7 10*3/uL — ABNORMAL HIGH (ref 4.0–10.5)

## 2021-12-10 LAB — HEMOGLOBIN A1C: Hgb A1c MFr Bld: 5.4 % (ref 4.6–6.5)

## 2021-12-10 LAB — HEPATIC FUNCTION PANEL
ALT: 7 U/L (ref 0–35)
AST: 14 U/L (ref 0–37)
Albumin: 4.2 g/dL (ref 3.5–5.2)
Alkaline Phosphatase: 62 U/L (ref 39–117)
Bilirubin, Direct: 0.2 mg/dL (ref 0.0–0.3)
Total Bilirubin: 0.9 mg/dL (ref 0.2–1.2)
Total Protein: 6.8 g/dL (ref 6.0–8.3)

## 2021-12-10 LAB — T3, FREE: T3, Free: 3.7 pg/mL (ref 2.3–4.2)

## 2021-12-10 LAB — T4, FREE: Free T4: 1.94 ng/dL — ABNORMAL HIGH (ref 0.60–1.60)

## 2021-12-10 LAB — TSH: TSH: <0.01 u[IU]/mL — ABNORMAL LOW (ref 0.35–5.50)

## 2021-12-10 MED ORDER — HYDROCODONE-ACETAMINOPHEN 10-325 MG PO TABS: 1.0000 | ORAL_TABLET | ORAL | 0 refills | Status: AC | PRN

## 2021-12-10 NOTE — Progress Notes (Signed)
   Subjective:    Patient ID: Brooke Combs, female    DOB: [birth year], 76 y.o.   MRN: [id number]  HPI Here for several issues. First she fell about a week ago while trying to knock down a wasp nest, and she landed on her bottom in the yard. She has had pain in the lower back since then. She is using Ibuprofen and heat, and she asks for a small supply of pain medication. Also she has been having more TMJ pain when she chews lately. She had dentures made 18 months ago when she was living in [place], but they do not fit well now. Lastly she is fasting for her yearly lab work.    Review of Systems  Constitutional: Negative.   HENT:  Positive for dental problem.   Respiratory: Negative.    Cardiovascular: Negative.   Musculoskeletal:  Positive for back pain.      Objective:   Physical Exam Constitutional:      General: She is not in acute distress.    Appearance: Normal appearance.  HENT:     Mouth/Throat:     Comments: Both TMJ joints are tender. She is wearing her dentures, and they clearly do not fit properly. Her jaws are quite out of alignment.  Cardiovascular:     Rate and Rhythm: Normal rate and regular rhythm.     Pulses: Normal pulses.     Heart sounds: Normal heart sounds.  Pulmonary:     Effort: Pulmonary effort is normal.     Breath sounds: Normal breath sounds.  Musculoskeletal:     Comments: Tender in the lower back with full ROM   Neurological:     Mental Status: She is alert.          Assessment & Plan:  She has some pain from a recent fall, so she can use Norco as needed while it heals. She needs to see a dentist again, so we looked over a list of local dentists who take Medicaid. We will also get fasting labs today to check a thyroid panel, lipids, etc.  Gershon Crane, MD

## 2021-12-23 ENCOUNTER — Telehealth: Payer: Self-pay | Admitting: Family Medicine

## 2021-12-23 NOTE — Telephone Encounter (Signed)
Spoke to patient to schedule Medicare Annual Wellness Visit (AWV) either virtually or in office.  She wanted call back end of June    07/18/13 per [facility]   please schedule at anytime with [facility] Nurse Health Advisor 1 or 2   This should be a 45 minute visit.

## 2022-01-10 ENCOUNTER — Telehealth: Payer: Self-pay | Admitting: Family Medicine

## 2022-02-04 ENCOUNTER — Other Ambulatory Visit: Payer: Self-pay | Admitting: Family Medicine

## 2022-02-04 NOTE — Telephone Encounter (Signed)
Last OV- 12/10/2021 Last refill- 07/14/21-90 tabs, 5 refills  No future OV scheduled.

## 2022-02-22 ENCOUNTER — Telehealth: Payer: Self-pay | Admitting: Family Medicine

## 2022-02-22 NOTE — Telephone Encounter (Signed)
Left message for patient to call back and schedule Medicare Annual Wellness Visit (AWV) either virtually or in office. Left  my Brooke Combs number [phone]   awvi 07/18/13 per [facility]  please schedule at anytime with [facility] Nurse Health Advisor 1 or 2   This should be a 45 minute visit.

## 2022-03-15 ENCOUNTER — Telehealth: Payer: Self-pay | Admitting: Family Medicine

## 2022-03-15 ENCOUNTER — Other Ambulatory Visit: Payer: Self-pay

## 2022-03-15 DIAGNOSIS — J40 Bronchitis, not specified as acute or chronic: Secondary | ICD-10-CM

## 2022-03-15 MED ORDER — ALBUTEROL SULFATE HFA 108 (90 BASE) MCG/ACT IN AERS: INHALATION_SPRAY | RESPIRATORY_TRACT | 1 refills | Status: DC

## 2022-03-15 MED ORDER — FLUTICASONE PROPIONATE HFA 44 MCG/ACT IN AERO: INHALATION_SPRAY | RESPIRATORY_TRACT | 1 refills | Status: DC

## 2022-03-15 NOTE — Telephone Encounter (Signed)
Spoke with patient, refills for both requested medications sent to [facility]

## 2022-03-15 NOTE — Telephone Encounter (Signed)
Pt called to say she usually gets a year's worth / yearly prescription of the following:  albuterol (VENTOLIN HFA) 108 (90 Base) MCG/ACT inhaler  FLOVENT HFA 44 MCG/ACT inhaler  But, now she sees she only got 1 refill of each.   Pt states she was only able to get one of each this month, and that was over a week ago.  Pt would like to know why?  Pt would like a 6 month or 1 year supply of both of these medications.  LOV:  12/10/2021  Please advise.   [id number] [name], [place] - [id number] Phone:  [phone]  Fax:  [phone]

## 2022-03-15 NOTE — Progress Notes (Deleted)
, °

## 2022-03-22 ENCOUNTER — Other Ambulatory Visit: Payer: Self-pay | Admitting: Family Medicine

## 2022-04-22 ENCOUNTER — Telehealth: Payer: Self-pay | Admitting: Family Medicine

## 2022-04-22 ENCOUNTER — Encounter: Payer: Self-pay | Admitting: Family Medicine

## 2022-04-22 ENCOUNTER — Ambulatory Visit (INDEPENDENT_AMBULATORY_CARE_PROVIDER_SITE_OTHER): Payer: Medicare HMO | Admitting: Family Medicine

## 2022-04-22 ENCOUNTER — Other Ambulatory Visit: Payer: Self-pay | Admitting: Family Medicine

## 2022-04-22 VITALS — BP 128/68 | HR 77 | Temp 98.7°F | Wt 105.1 lb

## 2022-04-22 DIAGNOSIS — Z23 Encounter for immunization: Secondary | ICD-10-CM

## 2022-04-22 DIAGNOSIS — S39012A Strain of muscle, fascia and tendon of lower back, initial encounter: Secondary | ICD-10-CM

## 2022-04-22 MED ORDER — HYDROCODONE-ACETAMINOPHEN 10-325 MG PO TABS: 1.0000 | ORAL_TABLET | ORAL | 0 refills | Status: AC | PRN

## 2022-04-22 MED ORDER — HYDROCODONE-ACETAMINOPHEN 10-325 MG PO TABS: 1.0000 | ORAL_TABLET | ORAL | 0 refills | Status: DC | PRN

## 2022-04-22 NOTE — Telephone Encounter (Signed)
Pt is calling and  [id number] - [place], [place]. Phone:  [phone]  Fax:  [phone]    Do not have in stock HYDROcodone-acetaminophen ([place]) 10-325 MG tablet please cancel rx and send to HYDROcodone-acetaminophen [facility]) 10-325 MG tablet [id number] [place], [place] - [address] Phone:  [phone]  Fax:  [phone]

## 2022-04-22 NOTE — Telephone Encounter (Signed)
Please res[facility] [place].   Pharmacy updated.

## 2022-04-22 NOTE — Telephone Encounter (Signed)
Called pt aware prescription has been resent.

## 2022-04-22 NOTE — Progress Notes (Signed)
   Subjective:    Patient ID: Brooke Combs, female    DOB: 1946/05/24, 76 y.o.   MRN: [id number]  HPI Here for 2 days of sharp left sided low back pain that radiates down the left leg. No numbness. Using heat. She is in the process of moving back to [place], [place], and she is doing most of the lifting and moving of boxes. She will officially move in tomorrow.    Review of Systems  Constitutional: Negative.   Respiratory: Negative.    Cardiovascular: Negative.   Musculoskeletal:  Positive for back pain.       Objective:   Physical Exam Constitutional:      Appearance: Normal appearance.  Cardiovascular:     Rate and Rhythm: Normal rate and regular rhythm.     Pulses: Normal pulses.     Heart sounds: Normal heart sounds.  Pulmonary:     Effort: Pulmonary effort is normal.     Breath sounds: Normal breath sounds.  Musculoskeletal:     Comments: She is tender in the left lower back and over the left sciatic notch. ROM of the spine is full   Neurological:     Mental Status: She is alert.           Assessment & Plan:  Lumbar strain. We will provide her with some Norco to use as needed.  Alysia Penna, MD

## 2022-04-22 NOTE — Addendum Note (Signed)
Addended by: Wyvonne Lenz on: 04/22/2022 11:50 AM   Modules accepted: Orders

## 2022-04-22 NOTE — Telephone Encounter (Signed)
Done

## 2022-05-09 ENCOUNTER — Ambulatory Visit (INDEPENDENT_AMBULATORY_CARE_PROVIDER_SITE_OTHER): Payer: Medicare HMO | Admitting: Family Medicine

## 2022-05-09 ENCOUNTER — Encounter: Payer: Self-pay | Admitting: Family Medicine

## 2022-05-09 VITALS — BP 136/72 | HR 78 | Temp 98.8°F | Wt 106.8 lb

## 2022-05-09 DIAGNOSIS — M25472 Effusion, left ankle: Secondary | ICD-10-CM | POA: Diagnosis not present

## 2022-05-09 DIAGNOSIS — M25471 Effusion, right ankle: Secondary | ICD-10-CM

## 2022-05-09 DIAGNOSIS — S96911A Strain of unspecified muscle and tendon at ankle and foot level, right foot, initial encounter: Secondary | ICD-10-CM | POA: Diagnosis not present

## 2022-05-09 NOTE — Patient Instructions (Signed)
Continue using ice, heat, elevation, and ankle brace or Ace bandage for compression of your ankle.  Your ankle will likely continue to be sore and have some swelling for the next few weeks.

## 2022-05-09 NOTE — Progress Notes (Signed)
Subjective:    Patient ID: Brooke Combs, female    DOB: 27-Oct-1945, 76 y.o.   MRN: [id number]  Chief Complaint  Patient presents with   Joint Swelling    Right is worse than left, going on for 4 days. Has been moving, moving boxes and things. Has iced, heating and elevated them the last 2 days, but nothing has helped. Is very painful, tylenol, and aleve but neither has helped.     HPI Patient is a 76 year old female with pmh sig for COPD, GERD, anxiety, Graves' disease, HLD followed by Dr. Sarajane Jews and seen today for acute concern.  Patient endorses bilateral ankle edema and pain right greater than left x4 days.  Patient states she has been moving boxes as she is packing up her home to move.  Patient tried ice, heat, Tylenol, Aleve for symptoms.  Patient denies injuring ankle.    Past Medical History:  Diagnosis Date   Anxiety    COPD (chronic obstructive pulmonary disease) (HCC)    GERD (gastroesophageal reflux disease)    History of Graves' disease    had radioactive iodine ablation    Hyperlipidemia    Thyroid disease    hypothyroidism    Allergies  Allergen Reactions   Celecoxib Other (See Comments)    Fatigue   Estrogens Other (See Comments)    Blurry vision   Tramadol Other (See Comments)    constipation   Etodolac Rash    Ants crawling in hands /blister in hand and right arm Lodine    ROS General: Denies fever, chills, night sweats, changes in weight, changes in appetite HEENT: Denies headaches, ear pain, changes in vision, rhinorrhea, sore throat CV: Denies CP, palpitations, SOB, orthopnea Pulm: Denies SOB, cough, wheezing GI: Denies abdominal pain, nausea, vomiting, diarrhea, constipation GU: Denies dysuria, hematuria, frequency, vaginal discharge Msk: Denies muscle cramps, joint pains + bilateral ankle edema right greater than left, right ankle pain. Neuro: Denies weakness, numbness, tingling Skin: Denies rashes, bruising Psych: Denies depression, anxiety,  hallucinations     Objective:    Blood pressure 136/72, pulse 78, temperature 98.8 F (37.1 C), temperature source Oral, weight 106 lb 12.8 oz (48.4 kg), SpO2 97 %.  Gen. Pleasant, well-nourished, in no distress, normal affect   HEENT: [place]/AT, face symmetric, conjunctiva clear, no scleral icterus, PERRLA, EOMI, nares patent without drainage Lungs: no accessory muscle use Cardiovascular: RRR, no peripheral edema.  No calf tenderness. Musculoskeletal: Wearing lifestride slide on dress type shoe with small heel.  Mild edema of the right ankle.  2+ DP and PT pulses bilaterally.  TTP of medial and lateral right ankle no deformity, instability of bilateral ankles.  No cyanosis or clubbing, normal tone Neuro:  A&Ox3, CN II-XII intact, ambulating with cane. Skin:  Warm, no lesions/ rash   Wt Readings from Last 3 Encounters:  05/09/22 106 lb 12.8 oz (48.4 kg)  04/22/22 105 lb 2 oz (47.7 kg)  12/10/21 107 lb 12.8 oz (48.9 kg)    Lab Results  Component Value Date   WBC 10.7 (H) 12/10/2021   HGB 12.7 12/10/2021   HCT 37.9 12/10/2021   PLT 308.0 12/10/2021   GLUCOSE 103 (H) 12/10/2021   CHOL 189 12/10/2021   TRIG 84.0 12/10/2021   HDL 60.10 12/10/2021   LDLDIRECT 131.5 11/11/2011   LDLCALC 113 (H) 12/10/2021   ALT 7 12/10/2021   AST 14 12/10/2021   NA 141 12/10/2021   K 5.5 No hemolysis seen (H) 12/10/2021   CL  105 12/10/2021   CREATININE 0.91 12/10/2021   BUN 19 12/10/2021   CO2 30 12/10/2021   TSH <0.01 Repeated and verified X2. (L) 12/10/2021   HGBA1C 5.4 12/10/2021    Assessment/Plan:  Ankle edema, bilateral  Strain of right ankle, initial encounter  Discussed ankle edema and pain 2/2 overuse causing inflammation.  Discussed continuing current supportive care with ice, heat, compression, elevation.  Advised on likely duration of symptoms.  Offered to wrap right ankle in clinic however patient declined as has ankle brace and Ace bandage at home.  Advised to wear supportive  shoes with good arch support.  Given handout on ankle exercises.  F/u prn in 3-4 wks for continued or worsened symptoms.  Abbe Amsterdam, MD

## 2022-05-16 ENCOUNTER — Telehealth: Payer: Self-pay | Admitting: Family Medicine

## 2022-05-16 NOTE — Telephone Encounter (Signed)
Spoke with patient to schedule Medicare Annual Wellness Visit (AWV) either virtually or in office.  Patient request call in dec.  Stated she has a lot going on right now     awvi 07/18/13 per [facility] please schedule with Nurse Health Adviser   45 min for awv-i and in office appointments 30 min for awv-s  phone/virtual appointments

## 2022-05-23 ENCOUNTER — Other Ambulatory Visit: Payer: Self-pay | Admitting: Family Medicine

## 2022-05-23 DIAGNOSIS — J40 Bronchitis, not specified as acute or chronic: Secondary | ICD-10-CM

## 2022-06-02 ENCOUNTER — Other Ambulatory Visit: Payer: Self-pay | Admitting: Family Medicine

## 2022-06-22 ENCOUNTER — Telehealth: Payer: Self-pay | Admitting: Family Medicine

## 2022-06-22 NOTE — Telephone Encounter (Signed)
Left message for patient to call back and schedule Medicare Annual Wellness Visit (AWV) either virtually or in office. Left  my Brooke Combs number [phone]   awvi 07/18/13 per [facility] please schedule with Nurse Health Adviser   45 min for awv-i and in office appointments 30 min for awv-s  phone/virtual appointments

## 2022-07-28 ENCOUNTER — Other Ambulatory Visit: Payer: Self-pay | Admitting: Family Medicine

## 2022-07-28 DIAGNOSIS — J40 Bronchitis, not specified as acute or chronic: Secondary | ICD-10-CM

## 2022-08-12 ENCOUNTER — Telehealth: Payer: Self-pay | Admitting: Family Medicine

## 2022-08-12 NOTE — Telephone Encounter (Signed)
Left message for patient to call back and schedule Medicare Annual Wellness Visit (AWV) either virtually or in office. Left  my Brooke Combs number [phone]   awvi 07/18/13 per [facility]  please schedule with Nurse Health Adviser   45 min for awv-i  in office appointments 30 min for awv-s & awv-i phone/virtual appointments

## 2022-08-28 ENCOUNTER — Other Ambulatory Visit: Payer: Self-pay | Admitting: Family Medicine

## 2022-08-29 ENCOUNTER — Other Ambulatory Visit: Payer: Self-pay | Admitting: Family Medicine

## 2022-08-29 NOTE — Telephone Encounter (Signed)
Pt LOV was on 04/22/2022 Last refill was done on 02/07/22 Please advise

## 2022-09-26 ENCOUNTER — Ambulatory Visit: Payer: Medicaid Other | Admitting: Family Medicine

## 2022-10-04 ENCOUNTER — Telehealth: Payer: Self-pay | Admitting: Family Medicine

## 2022-10-04 NOTE — Telephone Encounter (Signed)
Called patient to schedule Medicare Annual Wellness Visit (AWV). Left message for patient to call back and schedule Medicare Annual Wellness Visit (AWV).  Last date of AWV:  awvi 07/18/13 per [facility]   Please schedule an appointment at any time with [facility] or hannha kim  .  If any questions, please contact me at [phone].  Thank you ,  Barkley Boards AWV direct phone # [phone]   Several message have been left  lm 10/04/22  08/12/22  06/22/22  cb dec lot going on right now 05/16/22  cb mid oct 02/22/22 appt 10/23   lm 02/22/22  01/10/22  cb end 12/2021 11/16/21  cb 11/2021 awvi 07/18/13 per [facility]   09/2021 a lot going on 08/10/21  appt 2/16/  l/m 06/17/21   05/13/21  03/04/21  call back mid aug 02/10/21  due 8/2022wanted call back in aug 2022 checked 12/15/20 wcb 09/23/20  awvi 07/18/13 per [facility]

## 2022-10-05 ENCOUNTER — Other Ambulatory Visit: Payer: Self-pay | Admitting: Family Medicine

## 2022-10-05 DIAGNOSIS — J40 Bronchitis, not specified as acute or chronic: Secondary | ICD-10-CM

## 2022-10-11 ENCOUNTER — Encounter: Payer: Self-pay | Admitting: Family Medicine

## 2022-10-11 ENCOUNTER — Ambulatory Visit (INDEPENDENT_AMBULATORY_CARE_PROVIDER_SITE_OTHER): Payer: Medicare HMO | Admitting: Family Medicine

## 2022-10-11 VITALS — BP 128/72 | HR 92 | Temp 97.9°F | Wt 110.4 lb

## 2022-10-11 DIAGNOSIS — J439 Emphysema, unspecified: Secondary | ICD-10-CM | POA: Diagnosis not present

## 2022-10-11 DIAGNOSIS — E785 Hyperlipidemia, unspecified: Secondary | ICD-10-CM

## 2022-10-11 DIAGNOSIS — R69 Illness, unspecified: Secondary | ICD-10-CM | POA: Diagnosis not present

## 2022-10-11 DIAGNOSIS — G8929 Other chronic pain: Secondary | ICD-10-CM

## 2022-10-11 DIAGNOSIS — R739 Hyperglycemia, unspecified: Secondary | ICD-10-CM

## 2022-10-11 DIAGNOSIS — K219 Gastro-esophageal reflux disease without esophagitis: Secondary | ICD-10-CM | POA: Diagnosis not present

## 2022-10-11 DIAGNOSIS — F411 Generalized anxiety disorder: Secondary | ICD-10-CM

## 2022-10-11 DIAGNOSIS — E782 Mixed hyperlipidemia: Secondary | ICD-10-CM

## 2022-10-11 DIAGNOSIS — M5441 Lumbago with sciatica, right side: Secondary | ICD-10-CM | POA: Diagnosis not present

## 2022-10-11 DIAGNOSIS — G4700 Insomnia, unspecified: Secondary | ICD-10-CM

## 2022-10-11 DIAGNOSIS — E039 Hypothyroidism, unspecified: Secondary | ICD-10-CM

## 2022-10-11 DIAGNOSIS — N6323 Unspecified lump in the left breast, lower outer quadrant: Secondary | ICD-10-CM | POA: Diagnosis not present

## 2022-10-11 DIAGNOSIS — M5442 Lumbago with sciatica, left side: Secondary | ICD-10-CM | POA: Diagnosis not present

## 2022-10-11 LAB — CBC WITH DIFFERENTIAL/PLATELET
Basophils Absolute: 0.1 10*3/uL (ref 0.0–0.1)
Basophils Relative: 0.7 % (ref 0.0–3.0)
Eosinophils Absolute: 0.3 10*3/uL (ref 0.0–0.7)
Eosinophils Relative: 3.7 % (ref 0.0–5.0)
HCT: 38.1 % (ref 36.0–46.0)
Hemoglobin: 12.8 g/dL (ref 12.0–15.0)
Lymphocytes Relative: 30.3 % (ref 12.0–46.0)
Lymphs Abs: 2.7 10*3/uL (ref 0.7–4.0)
MCHC: 33.5 g/dL (ref 30.0–36.0)
MCV: 94.7 fl (ref 78.0–100.0)
Monocytes Absolute: 0.6 10*3/uL (ref 0.1–1.0)
Monocytes Relative: 6.5 % (ref 3.0–12.0)
Neutro Abs: 5.3 10*3/uL (ref 1.4–7.7)
Neutrophils Relative %: 58.8 % (ref 43.0–77.0)
Platelets: 319 10*3/uL (ref 150.0–400.0)
RBC: 4.02 Mil/uL (ref 3.87–5.11)
RDW: 12.7 % (ref 11.5–15.5)
WBC: 8.9 10*3/uL (ref 4.0–10.5)

## 2022-10-11 LAB — LIPID PANEL
Cholesterol: 178 mg/dL (ref 0–200)
HDL: 64.8 mg/dL (ref >39.00)
LDL Cholesterol: 91 mg/dL (ref 0–99)
NonHDL: 113.14
Total CHOL/HDL Ratio: 3
Triglycerides: 111 mg/dL (ref 0.0–149.0)
VLDL: 22.2 mg/dL (ref 0.0–40.0)

## 2022-10-11 LAB — TSH: TSH: 0.01 u[IU]/mL — ABNORMAL LOW (ref 0.35–5.50)

## 2022-10-11 LAB — HEPATIC FUNCTION PANEL
ALT: 11 U/L (ref 0–35)
AST: 16 U/L (ref 0–37)
Albumin: 4.5 g/dL (ref 3.5–5.2)
Alkaline Phosphatase: 69 U/L (ref 39–117)
Bilirubin, Direct: 0.1 mg/dL (ref 0.0–0.3)
Total Bilirubin: 0.9 mg/dL (ref 0.2–1.2)
Total Protein: 7.4 g/dL (ref 6.0–8.3)

## 2022-10-11 LAB — HEMOGLOBIN A1C: Hgb A1c MFr Bld: 5.5 % (ref 4.6–6.5)

## 2022-10-11 LAB — BASIC METABOLIC PANEL
BUN: 18 mg/dL (ref 6–23)
CO2: 27 mEq/L (ref 19–32)
Calcium: 9.5 mg/dL (ref 8.4–10.5)
Chloride: 104 mEq/L (ref 96–112)
Creatinine, Ser: 0.88 mg/dL (ref 0.40–1.20)
GFR: 64.4 mL/min (ref >60.00)
Glucose, Bld: 94 mg/dL (ref 70–99)
Potassium: 4 mEq/L (ref 3.5–5.1)
Sodium: 140 mEq/L (ref 135–145)

## 2022-10-11 LAB — T3, FREE: T3, Free: 3.5 pg/mL (ref 2.3–4.2)

## 2022-10-11 LAB — T4, FREE: Free T4: 1.68 ng/dL — ABNORMAL HIGH (ref 0.60–1.60)

## 2022-10-11 MED ORDER — HYDROCODONE-ACETAMINOPHEN 10-325 MG PO TABS: 1.0000 | ORAL_TABLET | ORAL | 0 refills | Status: AC | PRN

## 2022-10-11 NOTE — Progress Notes (Signed)
   Subjective:    Patient ID: Brooke Combs, female    DOB: 28-May-1946, 77 y.o.   MRN: [id number]  HPI Here for several issues. First she developed a deep dry cough about 2 weeks ago, and it seems to have resolved in the past few days. No fever or chest pain. She was mildly SOB for a few days, but not now. Also she is fasting for lab work. Finally she noticed a tender lump beneath the right breast about 10 days ago. She had silicone breast implants while in her 48's, and she has never had a mammogram.    Review of Systems  Constitutional: Negative.   HENT: Negative.    Eyes: Negative.   Respiratory:  Positive for cough, shortness of breath and wheezing.   Cardiovascular: Negative.   Gastrointestinal: Negative.   Genitourinary: Negative.   Musculoskeletal:  Positive for back pain.  Neurological: Negative.        Objective:   Physical Exam Constitutional:      Appearance: Normal appearance.  HENT:     Right Ear: Tympanic membrane, ear canal and external ear normal.     Left Ear: Tympanic membrane, ear canal and external ear normal.     Nose: Nose normal.     Mouth/Throat:     Pharynx: Oropharynx is clear.  Eyes:     Conjunctiva/sclera: Conjunctivae normal.  Cardiovascular:     Rate and Rhythm: Normal rate and regular rhythm.     Pulses: Normal pulses.     Heart sounds: Normal heart sounds.  Pulmonary:     Effort: Pulmonary effort is normal.     Breath sounds: Normal breath sounds.     Comments: She has bilateral implants which are quite firm, making examination of the breast tissue difficult. The right breast and axilla are clear. On the left side there is a firm, somewhat tender round mass at the periphery of the breast at the 5 o'clock position. The left axilla is clear.  Lymphadenopathy:     Cervical: No cervical adenopathy.  Neurological:     Mental Status: She is alert.           Assessment & Plan:  She has recovered from a viral URI. We will get fasting  labs for lipids, a thyroid panel, etc. We will evaluate the left breast lump with an [place]. I think getting a mammogram would be almost impossible given the hardness of her implants.  Alysia Penna, MD

## 2022-10-13 ENCOUNTER — Other Ambulatory Visit: Payer: Self-pay | Admitting: Family Medicine

## 2022-10-13 DIAGNOSIS — N6323 Unspecified lump in the left breast, lower outer quadrant: Secondary | ICD-10-CM

## 2022-10-17 ENCOUNTER — Telehealth: Payer: Self-pay | Admitting: Family Medicine

## 2022-10-17 DIAGNOSIS — N6321 Unspecified lump in the left breast, upper outer quadrant: Secondary | ICD-10-CM

## 2022-10-17 NOTE — Telephone Encounter (Signed)
Pt seen dr fry on 10-11-2022 and the breast center per pt told her they will not do ultrasound without her having mammogram and pt does not want to do mammogram and would like a referral to another facility for just breast ultrasound only

## 2022-10-18 NOTE — Telephone Encounter (Signed)
I understand. Please cancel my order for the mammogram. Call [facility] to see if she can have the [place] only

## 2022-10-18 NOTE — Telephone Encounter (Signed)
The only other thing to do is refer her to Surgery to let them evaluate the lump. I will do so if she agrees

## 2022-10-18 NOTE — Telephone Encounter (Signed)
Spoke with pt advised per Dr Sarajane Jews of referral to Surgery, pt has agreed to this

## 2022-10-18 NOTE — Telephone Encounter (Signed)
Spoke with [facility] regarding pt referral for breast US, Radiology Tech state that pt has no records of annual mammograms on file and radiology will need to have a mammogram done before the [place]. State that pt should have annual mammogram due to having implants.

## 2022-10-19 NOTE — Telephone Encounter (Signed)
Pt is aware she will get a call for scheduling

## 2022-10-19 NOTE — Telephone Encounter (Signed)
I did the referral to Surgery  ?

## 2022-10-31 ENCOUNTER — Telehealth: Payer: Self-pay | Admitting: Family Medicine

## 2022-10-31 NOTE — Telephone Encounter (Signed)
error 

## 2022-11-04 ENCOUNTER — Telehealth: Payer: Self-pay | Admitting: Family Medicine

## 2022-11-04 DIAGNOSIS — N6321 Unspecified lump in the left breast, upper outer quadrant: Secondary | ICD-10-CM

## 2022-11-04 NOTE — Telephone Encounter (Signed)
I did the referral for the biopsy

## 2022-11-04 NOTE — Telephone Encounter (Signed)
Pt called to inform MD that [facility] is requiring another order for the biopsy.

## 2022-11-09 NOTE — Telephone Encounter (Signed)
Pt was notified of referral.

## 2022-11-24 ENCOUNTER — Telehealth: Payer: Self-pay | Admitting: Family Medicine

## 2022-11-24 NOTE — Telephone Encounter (Signed)
Contacted Brooke Combs to schedule their annual wellness visit. Appointment made for 01/09/23.  Brooke Combs AWV direct phone # [phone]

## 2022-11-25 ENCOUNTER — Other Ambulatory Visit: Payer: Self-pay | Admitting: Family Medicine

## 2022-11-28 ENCOUNTER — Other Ambulatory Visit: Payer: Self-pay | Admitting: Family Medicine

## 2022-12-02 ENCOUNTER — Other Ambulatory Visit: Payer: Medicaid Other

## 2022-12-05 DIAGNOSIS — N6323 Unspecified lump in the left breast, lower outer quadrant: Secondary | ICD-10-CM | POA: Diagnosis not present

## 2022-12-19 ENCOUNTER — Ambulatory Visit
Admission: RE | Admit: 2022-12-19 | Discharge: 2022-12-19 | Disposition: A | Discharge status: Home / Self Care | Payer: Medicaid Other | Source: Ambulatory Visit | Attending: Family Medicine | Admitting: Family Medicine

## 2022-12-19 ENCOUNTER — Ambulatory Visit
Admission: RE | Admit: 2022-12-19 | Discharge: 2022-12-19 | Disposition: A | Discharge status: Home / Self Care | Payer: Medicare HMO | Source: Ambulatory Visit | Attending: Family Medicine | Admitting: Family Medicine

## 2022-12-19 ENCOUNTER — Other Ambulatory Visit: Payer: Self-pay | Admitting: Family Medicine

## 2022-12-19 DIAGNOSIS — N6323 Unspecified lump in the left breast, lower outer quadrant: Secondary | ICD-10-CM

## 2022-12-19 DIAGNOSIS — N6489 Other specified disorders of breast: Secondary | ICD-10-CM | POA: Diagnosis not present

## 2022-12-23 DIAGNOSIS — Z0100 Encounter for examination of eyes and vision without abnormal findings: Secondary | ICD-10-CM | POA: Diagnosis not present

## 2022-12-23 DIAGNOSIS — H2511 Age-related nuclear cataract, right eye: Secondary | ICD-10-CM | POA: Diagnosis not present

## 2022-12-23 DIAGNOSIS — H524 Presbyopia: Secondary | ICD-10-CM | POA: Diagnosis not present

## 2022-12-27 ENCOUNTER — Telehealth: Payer: Self-pay | Admitting: Family Medicine

## 2022-12-27 NOTE — Telephone Encounter (Signed)
FYI pt is calling to keep dr fry in the loop she had a mammogram and both of her breast implants has rupture and she will be getting them remove

## 2023-01-02 ENCOUNTER — Telehealth: Payer: Self-pay | Admitting: Family Medicine

## 2023-01-02 DIAGNOSIS — J40 Bronchitis, not specified as acute or chronic: Secondary | ICD-10-CM

## 2023-01-02 NOTE — Telephone Encounter (Signed)
Insurance doesn't cover presently prescribed steroid inhaler.  Insurance company states one that would be covered is Fluticasone.  Pharmacy- [facility] in [place], [place]-- [address]

## 2023-01-03 MED ORDER — FLUTICASONE PROPIONATE HFA 44 MCG/ACT IN AERO
INHALATION_SPRAY | RESPIRATORY_TRACT | 11 refills | Status: DC
Start: 2023-01-03 — End: 2023-01-09

## 2023-01-03 NOTE — Telephone Encounter (Signed)
Done

## 2023-01-06 NOTE — Telephone Encounter (Signed)
Brooke Combs with Brooke Combs medicare is calling and fluticasone need PA or md can change to arnuity ellipta and that med does not need PA

## 2023-01-07 ENCOUNTER — Other Ambulatory Visit: Payer: Self-pay | Admitting: Family Medicine

## 2023-01-07 DIAGNOSIS — J40 Bronchitis, not specified as acute or chronic: Secondary | ICD-10-CM

## 2023-01-09 ENCOUNTER — Ambulatory Visit (INDEPENDENT_AMBULATORY_CARE_PROVIDER_SITE_OTHER): Payer: Medicare HMO

## 2023-01-09 VITALS — Ht 64.0 in | Wt 110.0 lb

## 2023-01-09 DIAGNOSIS — Z0000 Encounter for general adult medical examination without abnormal findings: Secondary | ICD-10-CM

## 2023-01-09 MED ORDER — UMECLIDINIUM-VILANTEROL 62.5-25 MCG/ACT IN AEPB: 1.0000 | INHALATION_SPRAY | Freq: Every day | RESPIRATORY_TRACT | 11 refills | Status: DC

## 2023-01-09 NOTE — Progress Notes (Signed)
Subjective:   Brooke Combs is a 77 y.o. female who presents for Medicare Annual (Subsequent) preventive examination.  Visit Complete: Virtual  I connected with  Brooke Combs on 01/09/23 by a audio enabled telemedicine application and verified that I am speaking with the correct person using two identifiers.  Patient Location: Home  Provider Location: Home Office  I discussed the limitations of evaluation and management by telemedicine. The patient expressed understanding and agreed to proceed.  Patient Medicare AWV questionnaire was completed by the patient on ; I have confirmed that all information answered by patient is correct and no changes since this date.  Review of Systems     Cardiac Risk Factors include: advanced age (>71men, >51 women);Other (see comment), Risk factor comments: DX: COPD     Objective:    Today's Vitals   01/09/23 1515  Weight: 110 lb (49.9 kg)  Height: 5\' 4"  (1.626 m)   Body mass index is 18.88 kg/m.     01/09/2023    3:24 PM 04/15/2021   11:38 AM 04/15/2021    7:15 AM 04/08/2021    1:21 PM  Advanced Directives  Does Patient Have a Medical Advance Directive? Yes No Yes No  Type of Estate agent of Brooke Combs;Living will Out of facility DNR (pink MOST or yellow form) Healthcare Power of [place];Living will   Does patient want to make changes to medical advance directive? No - Patient declined     Copy of Healthcare Power of Attorney in Chart? Yes - validated most recent copy scanned in chart (See row information)  No - copy requested   Would patient like information on creating a medical advance directive?    No - Patient declined    Current Medications (verified) Outpatient Encounter Medications as of 01/09/2023  Medication Sig   acetaminophen (TYLENOL) 650 MG CR tablet Take 650 mg by mouth every 8 (eight) hours as needed for pain.   albuterol (VENTOLIN HFA) 108 (90 Base) MCG/ACT inhaler INHALE 2 PUFFS BY MOUTH  EVERY 4 HOURS AS NEEDED FOR WHEEZING OR SHORTNESS OF BREATH.   aspirin 81 MG tablet Take 81 mg by mouth daily.   Cholecalciferol (VITAMIN D) 125 MCG (5000 UT) CAPS Take 5,000 Units by mouth daily.   diazepam (VALIUM) 5 MG tablet TAKE 1 TABLET BY MOUTH EVERY 8 HOURS AS NEEDED   esomeprazole (NEXIUM) 20 MG capsule Take 1 capsule (20 mg total) by mouth 2 (two) times daily before a meal. Over then counter (Patient taking differently: Take 20 mg by mouth 2 (two) times daily before a meal. 24 Hours)   fluticasone (FLOVENT HFA) 44 MCG/ACT inhaler INHALE 2 PUFFS TWICE A DAY (IN MORNING AND AT BEDTIME)   simvastatin (ZOCOR) 40 MG tablet TAKE 1 TABLET BY MOUTH EVERYDAY AT BEDTIME   SYNTHROID 100 MCG tablet TAKE 1 TABLET BY MOUTH EVERY DAY BEFORE BREAKFAST   No facility-administered encounter medications on file as of 01/09/2023.    Allergies (verified) Celecoxib, Estrogens, Tramadol, and Etodolac   History: Past Medical History:  Diagnosis Date   Anxiety    COPD (chronic obstructive pulmonary disease) (HCC)    GERD (gastroesophageal reflux disease)    History of Graves' disease    had radioactive iodine ablation    Hyperlipidemia    Thyroid disease    hypothyroidism   Past Surgical History:  Procedure Laterality Date   AUGMENTATION MAMMAPLASTY     CYSTOSCOPY/URETEROSCOPY/HOLMIUM LASER/STENT PLACEMENT Left 04/16/2021   Procedure: CYSTOSCOPY/RETROGRADE/URETEROSCOPY/HOLMIUM LASER/STENT  PLACEMENT;  Surgeon: Brooke Paci, MD;  Location: [facility];  Service: Urology;  Laterality: Left;  ONLY NEEDS 45 MIN   EXPLORATORY LAPAROTOMY     age 56, foreign body removal   EXTRACORPOREAL SHOCK WAVE LITHOTRIPSY Left 04/15/2021   Procedure: EXTRACORPOREAL SHOCK WAVE LITHOTRIPSY (ESWL);  Surgeon: Brooke Paci, MD;  Location: [facility];  Service: Urology;  Laterality: Left;   VAGINAL HYSTERECTOMY     Family History  Problem Relation Age of Onset   Lupus Sister     Hypertension Sister    Social History   Socioeconomic History   Marital status: Married    Spouse name: Not on file   Number of children: Not on file   Years of education: Not on file   Highest education level: Not on file  Occupational History   Not on file  Tobacco Use   Smoking status: Former    Years: 30    Types: Cigarettes   Smokeless tobacco: Never   Tobacco comments:    1/2 pack per day  Vaping Use   Vaping Use: Never used  Substance and Sexual Activity   Alcohol use: No    Alcohol/week: 0.0 standard drinks of alcohol   Drug use: No   Sexual activity: Not on file  Other Topics Concern   Not on file  Social History Narrative   Not on file   Social Determinants of Health   Financial Resource Strain: Low Risk  (01/09/2023)   Overall Financial Resource Strain (CARDIA)    Difficulty of Paying Living Expenses: Not hard at all  Food Insecurity: No Food Insecurity (01/09/2023)   Hunger Vital Sign    Worried About Running Out of Food in the Last Year: Never true    Ran Out of Food in the Last Year: Never true  Transportation Needs: No Transportation Needs (01/09/2023)   PRAPARE - Administrator, Civil Service (Medical): No    Lack of Transportation (Non-Medical): No  Physical Activity: Inactive (01/09/2023)   Exercise Vital Sign    Days of Exercise per Week: 0 days    Minutes of Exercise per Session: 0 min  Stress: No Stress Concern Present (01/09/2023)   [employer] of Occupational Health - Occupational Stress Questionnaire    Feeling of Stress : Not at all  Social Connections: Moderately Integrated (01/09/2023)   Social Connection and Isolation Panel [NHANES]    Frequency of Communication with Friends and Family: More than three times a week    Frequency of Social Gatherings with Friends and Family: More than three times a week    Attends Religious Services: More than 4 times per year    Active Member of [employer] or Organizations: Yes    Attends Occupational hygienist Meetings: More than 4 times per year    Marital Status: Widowed    Tobacco Counseling Counseling given: Not Answered Tobacco comments: 1/2 pack per day   Clinical Intake:  Pre-visit preparation completed: No  Pain : No/denies pain     BMI - recorded: 18.88 Nutritional Status: BMI <19  Underweight Nutritional Risks: None Diabetes: No  How often do you need to have someone help you when you read instructions, pamphlets, or other written materials from your doctor or pharmacy?: 1 - Never  Interpreter Needed?: No  Information entered by :: Theresa Mulligan LPN   Activities of Daily Living    01/09/2023    3:21 PM  In your present state of  health, do you have any difficulty performing the following activities:  Hearing? 0  Vision? 0  Difficulty concentrating or making decisions? 0  Walking or climbing stairs? 0  Dressing or bathing? 0  Doing errands, shopping? 0  Preparing Food and eating ? N  Using the Toilet? N  In the past six months, have you accidently leaked urine? N  Do you have problems with loss of bowel control? N  Managing your Medications? N  Managing your Finances? N  Housekeeping or managing your Housekeeping? N    Patient Care Team: Nelwyn Salisbury, MD as PCP - General  Indicate any recent Medical Services you may have received from other than [facility] providers in the past year (date may be approximate).     Assessment:   This is a routine wellness examination for [place].  Hearing/Vision screen Hearing Screening - Comments:: Denies hearing difficulties   Vision Screening - Comments:: Wears rx glasses - up to date with routine eye exams with  [facility]  Dietary issues and exercise activities discussed:     Goals Addressed               This Visit's Progress     No current goals (pt-stated)         Depression Screen    01/09/2023    3:21 PM 05/09/2022    2:35 PM 04/22/2022   10:54 AM 12/10/2021    9:01 AM  10/25/2021    8:55 AM 09/02/2021    8:39 AM 01/27/2021   10:10 AM  PHQ 2/9 Scores  PHQ - 2 Score 0 0 0 0 0 0 0  PHQ- 9 Score  0 0 2 0 0 1    Fall Risk    01/09/2023    3:23 PM 05/09/2022    2:35 PM 04/22/2022   10:54 AM 12/10/2021    9:02 AM 09/02/2021    8:39 AM  Fall Risk   Falls in the past year? 0 0 0 1 0  Number falls in past yr: 0 0 0 0 0  Injury with Fall? 0 0 0 1 0  Risk for fall due to : No Fall Risks No Fall Risks No Fall Risks Impaired balance/gait No Fall Risks  Follow up Falls prevention discussed Falls evaluation completed Falls evaluation completed      MEDICARE RISK AT HOME:  Medicare Risk at Home - 01/09/23 1529     Any stairs in or around the home? No    If so, are there any without handrails? No    Home free of loose throw rugs in walkways, pet beds, electrical cords, etc? Yes    Adequate lighting in your home to reduce risk of falls? Yes    Life alert? No    Use of a cane, walker or w/c? No    Grab bars in the bathroom? No    Shower chair or bench in shower? No    Elevated toilet seat or a handicapped toilet? Yes             TIMED UP AND GO:  Was the test performed?  No    Cognitive Function:        01/09/2023    3:24 PM  6CIT Screen  What Year? 0 points  What month? 0 points  What time? 0 points  Count back from 20 0 points  Months in reverse 0 points  Repeat phrase 0 points  Total Score 0 points  Immunizations Immunization History  Administered Date(s) Administered   Fluad Quad(high Dose 65+) 04/22/2022   Influenza Split 03/27/2012, 04/06/2013, 03/18/2017   Influenza-Unspecified 04/03/2014, 04/15/2015, 04/06/2016, 03/09/2018   PFIZER(Purple Top)SARS-COV-2 Vaccination 11/28/2019, 12/19/2019, 06/28/2020   Pneumococcal Conjugate-13 04/06/2013, 04/05/2017   Pneumococcal Polysaccharide-23 04/06/2018   Pneumococcal-Unspecified 04/03/2014   Tdap 04/06/2016, 04/06/2018   Zoster Recombinat (Shingrix) 01/14/2017, 03/18/2017    TDAP  status: Up to date  Flu Vaccine status: Up to date  Pneumococcal vaccine status: Up to date  Covid-19 vaccine status: Completed vaccines  Qualifies for Shingles Vaccine? Yes    Zostavax completed Yes   Shingrix Completed?: Yes  Screening Tests Health Maintenance  Topic Date Due   COVID-19 Vaccine (4 - 2023-24 season) 01/25/2023 (Originally 03/18/2022)   DEXA SCAN  01/09/2024 (Originally 08/11/2012)   Colonoscopy  01/09/2024 (Originally 08/11/1992)   Hepatitis C Screening  01/09/2024 (Originally 08/11/1965)   INFLUENZA VACCINE  02/16/2023   Medicare Annual Wellness (AWV)  01/09/2024   DTaP/Tdap/Td (3 - Td or Tdap) 04/06/2028   Pneumonia Vaccine 20+ Years old  Completed   Zoster Vaccines- Shingrix  Completed   HPV VACCINES  Aged Out    Health Maintenance  There are no preventive care reminders to display for this patient.   Colorectal cancer screening: Referral to GI placed Deferred. Pt aware the office will call re: appt.  Mammogram status: No longer required due to Age.  Bone Density status: Ordered Deferred. Pt provided with contact info and advised to call to schedule appt.  Lung Cancer Screening: (Low Dose CT Chest recommended if Age 31-80 years, 20 pack-year currently smoking OR have quit w/in 15years.) does not qualify.     Additional Screening:  Hepatitis C Screening: does qualify;  Deferred  Vision Screening: Recommended annual ophthalmology exams for early detection of glaucoma and other disorders of the eye. Is the patient up to date with their annual eye exam?  Yes  Who is the provider or what is the name of the office in which the patient attends annual eye exams? [facility] If pt is not established with a provider, would they like to be referred to a provider to establish care? No .   Dental Screening: Recommended annual dental exams for proper oral hygiene    Community Resource Referral / Chronic Care Management:  CRR required this visit?  No    CCM required this visit?  No     Plan:     I have personally reviewed and noted the following in the patient's chart:   Medical and social history Use of alcohol, tobacco or illicit drugs  Current medications and supplements including opioid prescriptions. Patient is not currently taking opioid prescriptions. Functional ability and status Nutritional status Physical activity Advanced directives List of other physicians Hospitalizations, surgeries, and ER visits in previous 12 months Vitals Screenings to include cognitive, depression, and falls Referrals and appointments  In addition, I have reviewed and discussed with patient certain preventive protocols, quality metrics, and best practice recommendations. A written personalized care plan for preventive services as well as general preventive health recommendations were provided to patient.     Tillie Rung, LPN   1/61/0960   After Visit Summary: (MyChart) Due to this being a telephonic visit, the after visit summary with patients personalized plan was offered to patient via MyChart   Nurse Notes: Patient due Hep-C Screening

## 2023-01-09 NOTE — Patient Instructions (Addendum)
Brooke Combs , Thank you for taking time to come for your Medicare Wellness Visit. I appreciate your ongoing commitment to your health goals. Please review the following plan we discussed and let me know if I can assist you in the future.   These are the goals we discussed:  Goals       No current goals (pt-stated)        This is a list of the screening recommended for you and due dates:  Health Maintenance  Topic Date Due   COVID-19 Vaccine (4 - 2023-24 season) 01/25/2023*   DEXA scan (bone density measurement)  01/09/2024*   Colon Cancer Screening  01/09/2024*   Hepatitis C Screening  01/09/2024*   Flu Shot  02/16/2023   Medicare Annual Wellness Visit  01/09/2024   DTaP/Tdap/Td vaccine (3 - Td or Tdap) 04/06/2028   Pneumonia Vaccine  Completed   Zoster (Shingles) Vaccine  Completed   HPV Vaccine  Aged Out  *Topic was postponed. The date shown is not the original due date.    Advanced directives: In Chart  Conditions/risks identified: None  Next appointment: Follow up in one year for your annual wellness visit    Preventive Care 65 Years and Older, Female Preventive care refers to lifestyle choices and visits with your health care provider that can promote health and wellness. What does preventive care include? A yearly physical exam. This is also called an annual well check. Dental exams once or twice a year. Routine eye exams. Ask your health care provider how often you should have your eyes checked. Personal lifestyle choices, including: Daily care of your teeth and gums. Regular physical activity. Eating a healthy diet. Avoiding tobacco and drug use. Limiting alcohol use. Practicing safe sex. Taking low-dose aspirin every day. Taking vitamin and mineral supplements as recommended by your health care provider. What happens during an annual well check? The services and screenings done by your health care provider during your annual well check will depend on your  age, overall health, lifestyle risk factors, and family history of disease. Counseling  Your health care provider may ask you questions about your: Alcohol use. Tobacco use. Drug use. Emotional well-being. Home and relationship well-being. Sexual activity. Eating habits. History of falls. Memory and ability to understand (cognition). Work and work Astronomer. Reproductive health. Screening  You may have the following tests or measurements: Height, weight, and BMI. Blood pressure. Lipid and cholesterol levels. These may be checked every 5 years, or more frequently if you are over 12 years old. Skin check. Lung cancer screening. You may have this screening every year starting at age 41 if you have a 30-pack-year history of smoking and currently smoke or have quit within the past 15 years. Fecal occult blood test (FOBT) of the stool. You may have this test every year starting at age 50. Flexible sigmoidoscopy or colonoscopy. You may have a sigmoidoscopy every 5 years or a colonoscopy every 10 years starting at age 46. Hepatitis C blood test. Hepatitis B blood test. Sexually transmitted disease (STD) testing. Diabetes screening. This is done by checking your blood sugar (glucose) after you have not eaten for a while (fasting). You may have this done every 1-3 years. Bone density scan. This is done to screen for osteoporosis. You may have this done starting at age 47. Mammogram. This may be done every 1-2 years. Talk to your health care provider about how often you should have regular mammograms. Talk with your health care provider  about your test results, treatment options, and if necessary, the need for more tests. Vaccines  Your health care provider may recommend certain vaccines, such as: Influenza vaccine. This is recommended every year. Tetanus, diphtheria, and acellular pertussis (Tdap, Td) vaccine. You may need a Td booster every 10 years. Zoster vaccine. You may need this after  age 55. Pneumococcal 13-valent conjugate (PCV13) vaccine. One dose is recommended after age 96. Pneumococcal polysaccharide (PPSV23) vaccine. One dose is recommended after age 84. Talk to your health care provider about which screenings and vaccines you need and how often you need them. This information is not intended to replace advice given to you by your health care provider. Make sure you discuss any questions you have with your health care provider. Document Released: 07/31/2015 Document Revised: 03/23/2016 Document Reviewed: 05/05/2015 Elsevier Interactive Patient Education  2017 [employer] Prevention in the Home Falls can cause injuries. They can happen to people of all ages. There are many things you can do to make your home safe and to help prevent falls. What can I do on the outside of my home? Regularly fix the edges of walkways and driveways and fix any cracks. Remove anything that might make you trip as you walk through a door, such as a raised step or threshold. Trim any bushes or trees on the path to your home. Use bright outdoor lighting. Clear any walking paths of anything that might make someone trip, such as rocks or tools. Regularly check to see if handrails are loose or broken. Make sure that both sides of any steps have handrails. Any raised decks and porches should have guardrails on the edges. Have any leaves, snow, or ice cleared regularly. Use sand or salt on walking paths during winter. Clean up any spills in your garage right away. This includes oil or grease spills. What can I do in the bathroom? Use night lights. Install grab bars by the toilet and in the tub and shower. Do not use towel bars as grab bars. Use non-skid mats or decals in the tub or shower. If you need to sit down in the shower, use a plastic, non-slip stool. Keep the floor dry. Clean up any water that spills on the floor as soon as it happens. Remove soap buildup in the tub or shower  regularly. Attach bath mats securely with double-sided non-slip rug tape. Do not have throw rugs and other things on the floor that can make you trip. What can I do in the bedroom? Use night lights. Make sure that you have a light by your bed that is easy to reach. Do not use any sheets or blankets that are too big for your bed. They should not hang down onto the floor. Have a firm chair that has side arms. You can use this for support while you get dressed. Do not have throw rugs and other things on the floor that can make you trip. What can I do in the kitchen? Clean up any spills right away. Avoid walking on wet floors. Keep items that you use a lot in easy-to-reach places. If you need to reach something above you, use a strong step stool that has a grab bar. Keep electrical cords out of the way. Do not use floor polish or wax that makes floors slippery. If you must use wax, use non-skid floor wax. Do not have throw rugs and other things on the floor that can make you trip. What can I do  with my stairs? Do not leave any items on the stairs. Make sure that there are handrails on both sides of the stairs and use them. Fix handrails that are broken or loose. Make sure that handrails are as long as the stairways. Check any carpeting to make sure that it is firmly attached to the stairs. Fix any carpet that is loose or worn. Avoid having throw rugs at the top or bottom of the stairs. If you do have throw rugs, attach them to the floor with carpet tape. Make sure that you have a light switch at the top of the stairs and the bottom of the stairs. If you do not have them, ask someone to add them for you. What else can I do to help prevent falls? Wear shoes that: Do not have high heels. Have rubber bottoms. Are comfortable and fit you well. Are closed at the toe. Do not wear sandals. If you use a stepladder: Make sure that it is fully opened. Do not climb a closed stepladder. Make sure that  both sides of the stepladder are locked into place. Ask someone to hold it for you, if possible. Clearly mark and make sure that you can see: Any grab bars or handrails. First and last steps. Where the edge of each step is. Use tools that help you move around (mobility aids) if they are needed. These include: Canes. Walkers. Scooters. Crutches. Turn on the lights when you go into a dark area. Replace any light bulbs as soon as they burn out. Set up your furniture so you have a clear path. Avoid moving your furniture around. If any of your floors are uneven, fix them. If there are any pets around you, be aware of where they are. Review your medicines with your doctor. Some medicines can make you feel dizzy. This can increase your chance of falling. Ask your doctor what other things that you can do to help prevent falls. This information is not intended to replace advice given to you by your health care provider. Make sure you discuss any questions you have with your health care provider. Document Released: 04/30/2009 Document Revised: 12/10/2015 Document Reviewed: 08/08/2014 Elsevier Interactive Patient Education  2017 [employer].

## 2023-01-09 NOTE — Telephone Encounter (Signed)
I Dc'd the Fluticasone and sent in for Aneoro Elipta to use daily

## 2023-01-10 NOTE — Telephone Encounter (Signed)
Patient is aware 

## 2023-01-18 ENCOUNTER — Ambulatory Visit (INDEPENDENT_AMBULATORY_CARE_PROVIDER_SITE_OTHER): Payer: Medicare HMO | Admitting: Family Medicine

## 2023-01-18 ENCOUNTER — Encounter: Payer: Self-pay | Admitting: Family Medicine

## 2023-01-18 VITALS — BP 118/70 | HR 93 | Temp 98.9°F | Wt 118.0 lb

## 2023-01-18 DIAGNOSIS — T8543XD Leakage of breast prosthesis and implant, subsequent encounter: Secondary | ICD-10-CM

## 2023-01-18 MED ORDER — OXYCODONE-ACETAMINOPHEN 10-325 MG PO TABS: 1.0000 | ORAL_TABLET | ORAL | 0 refills | Status: DC | PRN

## 2023-01-18 NOTE — Progress Notes (Signed)
   Subjective:    Patient ID: Brooke Combs, female    DOB: [birth year], 77 y.o.   MRN: [id number]  HPI Here asking for advice about her ruptured silicone breast implant. We saw her on 10-11-22 for a mass in the left breast. On 12-19-22 a mammogram and [place] were performed which revealed this to be a ruptured implant. She has seen Dr. Luisa Hart who recommended that both implants be removed. Apparently the Surgery office was in the process of arranging such a surgery, but Brooke Combs has decided she would prefer a Engineer, petroleum do this.    Review of Systems  Constitutional: Negative.   Respiratory: Negative.    Cardiovascular:  Positive for chest pain. Negative for palpitations and leg swelling.       Objective:   Physical Exam Constitutional:      Appearance: Normal appearance.  Cardiovascular:     Rate and Rhythm: Normal rate and regular rhythm.     Pulses: Normal pulses.     Heart sounds: Normal heart sounds.  Pulmonary:     Effort: Pulmonary effort is normal.     Breath sounds: Normal breath sounds.  Neurological:     Mental Status: She is alert.           Assessment & Plan:  Ruptured silicone breast implant. We will refer her to Dr. Foster Simpson to remove both breast implants. Brooke Combs has decided she does not want to replace these with saline implants. Brooke Crane, MD

## 2023-01-23 ENCOUNTER — Ambulatory Visit: Payer: Medicare HMO | Admitting: Family Medicine

## 2023-02-24 ENCOUNTER — Institutional Professional Consult (permissible substitution): Payer: Medicare HMO | Admitting: Plastic Surgery

## 2023-02-27 ENCOUNTER — Ambulatory Visit: Payer: Medicare HMO | Admitting: Plastic Surgery

## 2023-02-27 ENCOUNTER — Encounter: Payer: Self-pay | Admitting: Plastic Surgery

## 2023-02-27 VITALS — BP 190/76 | HR 94 | Ht 63.0 in | Wt 112.0 lb

## 2023-02-27 DIAGNOSIS — T8543XA Leakage of breast prosthesis and implant, initial encounter: Secondary | ICD-10-CM | POA: Diagnosis not present

## 2023-02-27 NOTE — Progress Notes (Signed)
Patient ID: Brooke Combs, female    DOB: [birth year], 77 y.o.   MRN: [id number]   Chief Complaint  Patient presents with   Advice Only   Breast Problem    The patient is a 77 year old female here for evaluation of her breast implants.  She is 5 feet 3 inches tall weighs 110 pounds.  She had silicone implants placed over 30 years ago.  She is not sure what size they are.  She knows she went from probably in a cup brought to a C cup bra.  On recent exam she was noted to have a rupture of the silicone implants.  This was seen on a mammogram in June 2024 with a palpable abnormality in the 4 to 5 o'clock position of the left breast and confirmed to be consistent with silicone.  Was seen by Dr. Luisa Hart who agreed.  The patient is very thin and would like to have the implants removed without any new ones placed back in.  That is probably very good idea.  I do not see any irregularities of her skin.  He does have some tenderness on the left lateral breast area.     Review of Systems  Constitutional: Negative.   HENT: Negative.    Eyes: Negative.   Respiratory: Negative.    Cardiovascular: Negative.   Gastrointestinal: Negative.   Endocrine: Negative.   Genitourinary: Negative.   Musculoskeletal: Negative.     Past Medical History:  Diagnosis Date   Anxiety    COPD (chronic obstructive pulmonary disease) (HCC)    GERD (gastroesophageal reflux disease)    History of Graves' disease    had radioactive iodine ablation    Hyperlipidemia    Thyroid disease    hypothyroidism    Past Surgical History:  Procedure Laterality Date   AUGMENTATION MAMMAPLASTY     CYSTOSCOPY/URETEROSCOPY/HOLMIUM LASER/STENT PLACEMENT Left 04/16/2021   Procedure: CYSTOSCOPY/RETROGRADE/URETEROSCOPY/HOLMIUM LASER/STENT PLACEMENT;  Surgeon: Rene Paci, MD;  Location: [facility];  Service: Urology;  Laterality: Left;  ONLY NEEDS 45 MIN   EXPLORATORY LAPAROTOMY     age 89, foreign body removal    EXTRACORPOREAL SHOCK WAVE LITHOTRIPSY Left 04/15/2021   Procedure: EXTRACORPOREAL SHOCK WAVE LITHOTRIPSY (ESWL);  Surgeon: Rene Paci, MD;  Location: [facility];  Service: Urology;  Laterality: Left;   VAGINAL HYSTERECTOMY        Current Outpatient Medications:    acetaminophen (TYLENOL) 650 MG CR tablet, Take 650 mg by mouth every 8 (eight) hours as needed for pain., Disp: , Rfl:    albuterol (VENTOLIN HFA) 108 (90 Base) MCG/ACT inhaler, INHALE 2 PUFFS BY MOUTH EVERY 4 HOURS AS NEEDED FOR WHEEZING OR SHORTNESS OF BREATH., Disp: 8.5 each, Rfl: 1   aspirin 81 MG tablet, Take 81 mg by mouth daily., Disp: , Rfl:    Cholecalciferol (VITAMIN D) 125 MCG (5000 UT) CAPS, Take 5,000 Units by mouth daily., Disp: , Rfl:    diazepam (VALIUM) 5 MG tablet, TAKE 1 TABLET BY MOUTH EVERY 8 HOURS AS NEEDED (Patient taking differently: as needed.), Disp: 90 tablet, Rfl: 5   esomeprazole (NEXIUM) 20 MG capsule, Take 1 capsule (20 mg total) by mouth 2 (two) times daily before a meal. Over then counter (Patient taking differently: Take 20 mg by mouth 2 (two) times daily before a meal. 24 Hours), Disp: 1 capsule, Rfl: 0   oxyCODONE-acetaminophen (PERCOCET) 10-325 MG tablet, Take 1 tablet by mouth every 4 (four) hours as needed  for pain., Disp: 30 tablet, Rfl: 0   simvastatin (ZOCOR) 40 MG tablet, TAKE 1 TABLET BY MOUTH EVERYDAY AT BEDTIME, Disp: 90 tablet, Rfl: 0   SYNTHROID 100 MCG tablet, TAKE 1 TABLET BY MOUTH EVERY DAY BEFORE BREAKFAST, Disp: 90 tablet, Rfl: 1   Objective:   Vitals:   02/27/23 1239  BP: (!) 190/76  Pulse: 94  SpO2: 100%    Physical Exam Vitals and nursing note reviewed.  Constitutional:      Appearance: Normal appearance.  HENT:     Head: Normocephalic and atraumatic.  Cardiovascular:     Rate and Rhythm: Normal rate.     Pulses: Normal pulses.  Pulmonary:     Effort: Pulmonary effort is normal.  Abdominal:     Palpations: Abdomen is soft.  Skin:     General: Skin is warm.     Capillary Refill: Capillary refill takes less than 2 seconds.  Neurological:     Mental Status: She is alert and oriented to person, place, and time.  Psychiatric:        Mood and Affect: Mood normal.        Behavior: Behavior normal.        Thought Content: Thought content normal.        Judgment: Judgment normal.     Assessment & Plan:  Ruptured silicone breast implant, initial encounter  Plan for bilateral implant removal with drain placement in both breasts.  Patient does not want a mastopexy if insurance does not cover it.  He is aware that they will not cover a mastopexy.  Pictures were obtained of the patient and placed in the chart with the patient's or guardian's permission.   Alena Bills , DO

## 2023-03-10 ENCOUNTER — Institutional Professional Consult (permissible substitution): Payer: Medicare HMO | Admitting: Plastic Surgery

## 2023-03-17 ENCOUNTER — Telehealth: Payer: Self-pay | Admitting: Family Medicine

## 2023-03-17 NOTE — Telephone Encounter (Signed)
Pt flu vaccine updated.

## 2023-03-17 NOTE — Telephone Encounter (Signed)
Pt called to inform MD that she received her Flu Shot yesterday at [facility].  Also, she said she is still waiting to hear back from the plastic surgeons.

## 2023-04-06 ENCOUNTER — Ambulatory Visit (INDEPENDENT_AMBULATORY_CARE_PROVIDER_SITE_OTHER): Payer: Medicare HMO | Admitting: Family Medicine

## 2023-04-06 ENCOUNTER — Encounter: Payer: Self-pay | Admitting: Family Medicine

## 2023-04-06 VITALS — BP 124/62 | HR 66 | Temp 98.1°F | Wt 111.6 lb

## 2023-04-06 DIAGNOSIS — G8929 Other chronic pain: Secondary | ICD-10-CM

## 2023-04-06 DIAGNOSIS — R030 Elevated blood-pressure reading, without diagnosis of hypertension: Secondary | ICD-10-CM | POA: Diagnosis not present

## 2023-04-06 DIAGNOSIS — M5441 Lumbago with sciatica, right side: Secondary | ICD-10-CM

## 2023-04-06 MED ORDER — OXYCODONE-ACETAMINOPHEN 10-325 MG PO TABS: 1.0000 | ORAL_TABLET | ORAL | 0 refills | Status: DC | PRN

## 2023-04-06 NOTE — Progress Notes (Signed)
Subjective:    Patient ID: Brooke Combs, female    DOB: 1945/09/19, 77 y.o.   MRN: [id number]  HPI Here for several issues. First her sciatica pain has flared up again the past few days. She thinks this was from helping a friend bathe her mother. She has the usual sharp pain in the right lower back that shoots done the right leg. Also she is worried about her BP. When she saw Dr. Ulice Bold on 02-27-23 to discuss their plans to remove her silicone breast implants, her BP was 190/76. She was advised to see [place] for this. Since then she has been checking her BP at home and most readings are in the 120's or 130's systolic with 60's diastolic. On two occasions the systolic got up to 154.    Review of Systems  Constitutional: Negative.   Respiratory: Negative.    Cardiovascular: Negative.   Musculoskeletal:  Positive for back pain.       Objective:   Physical Exam Constitutional:      Appearance: Normal appearance.  Cardiovascular:     Rate and Rhythm: Normal rate and regular rhythm.     Pulses: Normal pulses.     Heart sounds: Normal heart sounds.  Pulmonary:     Effort: Pulmonary effort is normal.     Breath sounds: Normal breath sounds.  Neurological:     Mental Status: She is alert.           Assessment & Plan:  Her right sided sciatica has flared up, so we will give her another 30 tablets of Percocet to use as needed.per her request, we also gave her instructions on doing some simple stretches and exercises for he rlow back at home. As for her BP, this is stable almost all of the time. The high reading obtained in Dr. Kittie Plater office was clearly the result of her being stressed. They are having difficulty getting her insurance company to cover her surgery, and she has been upset about this. She will continue to watch the BP at home. Gershon Crane, MD

## 2023-04-10 ENCOUNTER — Other Ambulatory Visit: Payer: Self-pay | Admitting: Family Medicine

## 2023-04-12 ENCOUNTER — Encounter: Payer: Self-pay | Admitting: Surgical

## 2023-04-12 ENCOUNTER — Ambulatory Visit (INDEPENDENT_AMBULATORY_CARE_PROVIDER_SITE_OTHER): Payer: Medicare HMO | Admitting: Surgical

## 2023-04-12 ENCOUNTER — Telehealth: Payer: Self-pay

## 2023-04-12 VITALS — BP 157/76 | HR 85 | Ht 67.0 in | Wt 111.8 lb

## 2023-04-12 DIAGNOSIS — T8543XA Leakage of breast prosthesis and implant, initial encounter: Secondary | ICD-10-CM

## 2023-04-12 NOTE — H&P (View-Only) (Signed)
Patient ID: Brooke Combs, female    DOB: 06-11-1946, 77 y.o.   MRN: [id number]  Chief Complaint  Patient presents with   Pre-op Exam      ICD-10-CM   1. Ruptured silicone breast implant, initial encounter  T85.43XA       History of Present Illness: Brooke Combs is a 77 y.o.  female  with a history of breast augmentation about 30 years ago.  She presents for preoperative evaluation for upcoming procedure, removal of bilateral breast implants and bilateral capsulectomy, scheduled for 05/01/2023 with Dr. Ulice Bold.  The patient has not had problems with anesthesia. No history of DVT/PE. No family or personal history of bleeding or clotting disorders.  Patient is not currently taking any blood thinners.  No history of CVA/MI.   She reports her sister has had a history of pulmonary embolisms, she reports that her sister is very ill and has congestive heart failure and various other medical conditions that she attributes to PE tube.  No other known family history of PE or DVT.  Patient does have COPD, she feels as if it is well-controlled and uses her albuterol daily.  She reports no current shortness of breath, chest pain or any other pulmonary/cardiac symptoms.  Job: Retired  [employer] Significant for: Breast augmentation 30 years ago with ruptured implant. Patient reports has been doing well lately, no recent changes to her health.  She reports she is little bit nervous for surgery but otherwise feels prepared.  She does report that she takes Percocet 10 mg as needed for back pain, prescribed by her PCP Dr. Gershon Crane.  She reports she received a recent refill, but she reports 30 days typically lasts her 2 to 3 months as she only takes it as needed for severe pain.  She denies any cardiac disease.  She does have COPD as noted above. She is on ASA 81 mg daily, she takes this for preventative reasons.   Past Medical History: Allergies: Allergies  Allergen Reactions    Celecoxib Other (See Comments)    Fatigue   Estrogens Other (See Comments)    Blurry vision   Tramadol Other (See Comments)    constipation   Etodolac Rash    Ants crawling in hands /blister in hand and right arm Lodine    Current Medications:  Current Outpatient Medications:    albuterol (VENTOLIN HFA) 108 (90 Base) MCG/ACT inhaler, INHALE 2 PUFFS BY MOUTH EVERY 4 HOURS AS NEEDED FOR WHEEZING OR SHORTNESS OF BREATH., Disp: 8.5 each, Rfl: 1   aspirin 81 MG tablet, Take 81 mg by mouth daily., Disp: , Rfl:    Cholecalciferol (VITAMIN D) 125 MCG (5000 UT) CAPS, Take 5,000 Units by mouth daily., Disp: , Rfl:    esomeprazole (NEXIUM) 20 MG capsule, Take 1 capsule (20 mg total) by mouth 2 (two) times daily before a meal. Over then counter (Patient taking differently: Take 20 mg by mouth 2 (two) times daily before a meal. 24 Hours), Disp: 1 capsule, Rfl: 0   simvastatin (ZOCOR) 40 MG tablet, TAKE 1 TABLET BY MOUTH EVERYDAY AT BEDTIME, Disp: 90 tablet, Rfl: 0   SYNTHROID 100 MCG tablet, TAKE 1 TABLET BY MOUTH EVERY DAY BEFORE BREAKFAST, Disp: 90 tablet, Rfl: 1   acetaminophen (TYLENOL) 650 MG CR tablet, Take 650 mg by mouth every 8 (eight) hours as needed for pain. (Patient not taking: Reported on 04/12/2023), Disp: , Rfl:    diazepam (VALIUM) 5 MG tablet,  TAKE 1 TABLET BY MOUTH EVERY 8 HOURS AS NEEDED (Patient not taking: Reported on 04/12/2023), Disp: 90 tablet, Rfl: 5   oxyCODONE-acetaminophen (PERCOCET) 10-325 MG tablet, Take 1 tablet by mouth every 4 (four) hours as needed for pain. (Patient not taking: Reported on 04/12/2023), Disp: 30 tablet, Rfl: 0  Past Medical Problems: Past Medical History:  Diagnosis Date   Anxiety    COPD (chronic obstructive pulmonary disease) (HCC)    GERD (gastroesophageal reflux disease)    History of Graves' disease    had radioactive iodine ablation    Hyperlipidemia    Thyroid disease    hypothyroidism    Past Surgical History: Past Surgical History:   Procedure Laterality Date   AUGMENTATION MAMMAPLASTY     CYSTOSCOPY/URETEROSCOPY/HOLMIUM LASER/STENT PLACEMENT Left 04/16/2021   Procedure: CYSTOSCOPY/RETROGRADE/URETEROSCOPY/HOLMIUM LASER/STENT PLACEMENT;  Surgeon: Rene Paci, MD;  Location: [facility];  Service: Urology;  Laterality: Left;  ONLY NEEDS 45 MIN   EXPLORATORY LAPAROTOMY     age 17, foreign body removal   EXTRACORPOREAL SHOCK WAVE LITHOTRIPSY Left 04/15/2021   Procedure: EXTRACORPOREAL SHOCK WAVE LITHOTRIPSY (ESWL);  Surgeon: Rene Paci, MD;  Location: [facility];  Service: Urology;  Laterality: Left;   VAGINAL HYSTERECTOMY      Social History: Social History   Socioeconomic History   Marital status: Married    Spouse name: Not on file   Number of children: Not on file   Years of education: Not on file   Highest education level: Not on file  Occupational History   Not on file  Tobacco Use   Smoking status: Every Day    Types: Cigarettes   Smokeless tobacco: Never   Tobacco comments:    1/2 pack per day  Vaping Use   Vaping status: Never Used  Substance and Sexual Activity   Alcohol use: No    Alcohol/week: 0.0 standard drinks of alcohol   Drug use: No   Sexual activity: Not on file  Other Topics Concern   Not on file  Social History Narrative   Not on file   Social Determinants of Health   Financial Resource Strain: Low Risk  (01/09/2023)   Overall Financial Resource Strain (CARDIA)    Difficulty of Paying Living Expenses: Not hard at all  Food Insecurity: No Food Insecurity (01/09/2023)   Hunger Vital Sign    Worried About Running Out of Food in the Last Year: Never true    Ran Out of Food in the Last Year: Never true  Transportation Needs: No Transportation Needs (01/09/2023)   PRAPARE - Administrator, Civil Service (Medical): No    Lack of Transportation (Non-Medical): No  Physical Activity: Inactive (01/09/2023)   Exercise Vital Sign    Days of  Exercise per Week: 0 days    Minutes of Exercise per Session: 0 min  Stress: No Stress Concern Present (01/09/2023)   [employer] of Occupational Health - Occupational Stress Questionnaire    Feeling of Stress : Not at all  Social Connections: Moderately Integrated (01/09/2023)   Social Connection and Isolation Panel [NHANES]    Frequency of Communication with Friends and Family: More than three times a week    Frequency of Social Gatherings with Friends and Family: More than three times a week    Attends Religious Services: More than 4 times per year    Active Member of [employer] or Organizations: Yes    Attends Banker Meetings: More than 4  times per year    Marital Status: Widowed  Intimate Partner Violence: Not At Risk (01/09/2023)   Humiliation, Afraid, Rape, and Kick questionnaire    Fear of Current or Ex-Partner: No    Emotionally Abused: No    Physically Abused: No    Sexually Abused: No    Family History: Family History  Problem Relation Age of Onset   Lupus Sister    Hypertension Sister     Review of Systems: Review of Systems  Constitutional: Negative.   Respiratory: Negative.    Cardiovascular: Negative.   Gastrointestinal: Negative.   Musculoskeletal:  Positive for back pain.  Neurological: Negative.     Physical Exam: Vital Signs BP (!) 157/76 (BP Location: Left Arm, Patient Position: Sitting, Cuff Size: Small)   Pulse 85   Ht 5\' 7"  (1.702 m)   Wt 111 lb 12.8 oz (50.7 kg)   SpO2 96%   BMI 17.51 kg/m   Physical Exam  Constitutional:      General: Not in acute distress.    Appearance: Normal appearance. Not ill-appearing.  HENT:     Head: Normocephalic and atraumatic.  Eyes:     Pupils: Pupils are equal, round Neck:     Musculoskeletal: Normal range of motion.  Cardiovascular:     Rate and Rhythm: Normal rate    Pulses: Normal pulses.  Pulmonary:     Effort: Pulmonary effort is normal. No respiratory distress.  Abdominal:      General: Abdomen is flat. There is no distension.  Musculoskeletal: Normal range of motion.  Skin:    General: Skin is warm and dry.     Findings: No erythema or rash.  Neurological:     General: No focal deficit present.     Mental Status: Alert and oriented to person, place, and time. Mental status is at baseline.     Motor: No weakness.  Psychiatric:        Mood and Affect: Mood normal.        Behavior: Behavior normal.    Assessment/Plan: The patient is scheduled for bilateral breast implant removal and bilateral capsulectomy with Dr. Ulice Bold.  Risks, benefits, and alternatives of procedure discussed, questions answered and consent obtained.    Smoking Status: Daily smoker, 3 to 4 cigarettes/day; Counseling Given?  Discussed with patient she has an increased risk of postoperative complications due to nicotine use, encouraged patient to discontinue smoking prior to surgery.  Last Mammogram: Mammogram on 12/19/2022; Results: BI-RADS 2, benign with subsequent ultrasound.  She has free silicone at the 4-5 o'clock region of the left breast.  Caprini Score: 9, high; Risk Factors include: Age, sister with history of pulmonary embolism (provoked), COPD and length of planned surgery. Recommendation for mechanical and possible pharmacological prophylaxis. Encourage early ambulation.  Will discuss possible need for postoperative Lovenox with Dr. Ulice Bold.  Discussed with patient the importance of early ambulation after surgery.  Pictures obtained: @consult   Post-op Rx sent to pharmacy: Once clearance is received from PCP, will send oxycodone, Zofran, Keflex.  Patient was provided with the General Surgical Risk consent document and Pain Medication Agreement prior to their appointment.  They had adequate time to read through the risk consent documents and Pain Medication Agreement. We also discussed them in person together during this preop appointment. All of their questions were answered to  their satisfaction.  Recommended calling if they have any further questions.  Risk consent form and Pain Medication Agreement to be scanned into patient's chart.  Will send clearance to patient's PCP to request holding aspirin 7 days prior to surgery, will also request clearance to prescribe patient postoperative pain medications as she is on Percocet tens as needed for back pain.  I did discuss with the patient that if we prescribe some postoperative pain medications it would be oxycodone 5 mg which she can take for postsurgical pain, discussed with her the importance of not mixing her chronic Percocet 10 and oxycodone 5 mg.  This is particularly important for her as she is not consistently taking the Percocet 10mg  so she likely does not have a very high tolerance/baseline  Electronically signed by: Leslee Home, PA-C 04/12/2023 11:41 AM

## 2023-04-12 NOTE — Progress Notes (Signed)
Patient ID: Brooke Combs, female    DOB: 06-11-1946, 77 y.o.   MRN: [id number]  Chief Complaint  Patient presents with   Pre-op Exam      ICD-10-CM   1. Ruptured silicone breast implant, initial encounter  T85.43XA       History of Present Illness: Brooke Combs is a 77 y.o.  female  with a history of breast augmentation about 30 years ago.  She presents for preoperative evaluation for upcoming procedure, removal of bilateral breast implants and bilateral capsulectomy, scheduled for 05/01/2023 with Dr. Ulice Bold.  The patient has not had problems with anesthesia. No history of DVT/PE. No family or personal history of bleeding or clotting disorders.  Patient is not currently taking any blood thinners.  No history of CVA/MI.   She reports her sister has had a history of pulmonary embolisms, she reports that her sister is very ill and has congestive heart failure and various other medical conditions that she attributes to PE tube.  No other known family history of PE or DVT.  Patient does have COPD, she feels as if it is well-controlled and uses her albuterol daily.  She reports no current shortness of breath, chest pain or any other pulmonary/cardiac symptoms.  Job: Retired  [employer] Significant for: Breast augmentation 30 years ago with ruptured implant. Patient reports has been doing well lately, no recent changes to her health.  She reports she is little bit nervous for surgery but otherwise feels prepared.  She does report that she takes Percocet 10 mg as needed for back pain, prescribed by her PCP Dr. Gershon Crane.  She reports she received a recent refill, but she reports 30 days typically lasts her 2 to 3 months as she only takes it as needed for severe pain.  She denies any cardiac disease.  She does have COPD as noted above. She is on ASA 81 mg daily, she takes this for preventative reasons.   Past Medical History: Allergies: Allergies  Allergen Reactions    Celecoxib Other (See Comments)    Fatigue   Estrogens Other (See Comments)    Blurry vision   Tramadol Other (See Comments)    constipation   Etodolac Rash    Ants crawling in hands /blister in hand and right arm Lodine    Current Medications:  Current Outpatient Medications:    albuterol (VENTOLIN HFA) 108 (90 Base) MCG/ACT inhaler, INHALE 2 PUFFS BY MOUTH EVERY 4 HOURS AS NEEDED FOR WHEEZING OR SHORTNESS OF BREATH., Disp: 8.5 each, Rfl: 1   aspirin 81 MG tablet, Take 81 mg by mouth daily., Disp: , Rfl:    Cholecalciferol (VITAMIN D) 125 MCG (5000 UT) CAPS, Take 5,000 Units by mouth daily., Disp: , Rfl:    esomeprazole (NEXIUM) 20 MG capsule, Take 1 capsule (20 mg total) by mouth 2 (two) times daily before a meal. Over then counter (Patient taking differently: Take 20 mg by mouth 2 (two) times daily before a meal. 24 Hours), Disp: 1 capsule, Rfl: 0   simvastatin (ZOCOR) 40 MG tablet, TAKE 1 TABLET BY MOUTH EVERYDAY AT BEDTIME, Disp: 90 tablet, Rfl: 0   SYNTHROID 100 MCG tablet, TAKE 1 TABLET BY MOUTH EVERY DAY BEFORE BREAKFAST, Disp: 90 tablet, Rfl: 1   acetaminophen (TYLENOL) 650 MG CR tablet, Take 650 mg by mouth every 8 (eight) hours as needed for pain. (Patient not taking: Reported on 04/12/2023), Disp: , Rfl:    diazepam (VALIUM) 5 MG tablet,  TAKE 1 TABLET BY MOUTH EVERY 8 HOURS AS NEEDED (Patient not taking: Reported on 04/12/2023), Disp: 90 tablet, Rfl: 5   oxyCODONE-acetaminophen (PERCOCET) 10-325 MG tablet, Take 1 tablet by mouth every 4 (four) hours as needed for pain. (Patient not taking: Reported on 04/12/2023), Disp: 30 tablet, Rfl: 0  Past Medical Problems: Past Medical History:  Diagnosis Date   Anxiety    COPD (chronic obstructive pulmonary disease) (HCC)    GERD (gastroesophageal reflux disease)    History of Graves' disease    had radioactive iodine ablation    Hyperlipidemia    Thyroid disease    hypothyroidism    Past Surgical History: Past Surgical History:   Procedure Laterality Date   AUGMENTATION MAMMAPLASTY     CYSTOSCOPY/URETEROSCOPY/HOLMIUM LASER/STENT PLACEMENT Left 04/16/2021   Procedure: CYSTOSCOPY/RETROGRADE/URETEROSCOPY/HOLMIUM LASER/STENT PLACEMENT;  Surgeon: Rene Paci, MD;  Location: [facility];  Service: Urology;  Laterality: Left;  ONLY NEEDS 45 MIN   EXPLORATORY LAPAROTOMY     age 17, foreign body removal   EXTRACORPOREAL SHOCK WAVE LITHOTRIPSY Left 04/15/2021   Procedure: EXTRACORPOREAL SHOCK WAVE LITHOTRIPSY (ESWL);  Surgeon: Rene Paci, MD;  Location: [facility];  Service: Urology;  Laterality: Left;   VAGINAL HYSTERECTOMY      Social History: Social History   Socioeconomic History   Marital status: Married    Spouse name: Not on file   Number of children: Not on file   Years of education: Not on file   Highest education level: Not on file  Occupational History   Not on file  Tobacco Use   Smoking status: Every Day    Types: Cigarettes   Smokeless tobacco: Never   Tobacco comments:    1/2 pack per day  Vaping Use   Vaping status: Never Used  Substance and Sexual Activity   Alcohol use: No    Alcohol/week: 0.0 standard drinks of alcohol   Drug use: No   Sexual activity: Not on file  Other Topics Concern   Not on file  Social History Narrative   Not on file   Social Determinants of Health   Financial Resource Strain: Low Risk  (01/09/2023)   Overall Financial Resource Strain (CARDIA)    Difficulty of Paying Living Expenses: Not hard at all  Food Insecurity: No Food Insecurity (01/09/2023)   Hunger Vital Sign    Worried About Running Out of Food in the Last Year: Never true    Ran Out of Food in the Last Year: Never true  Transportation Needs: No Transportation Needs (01/09/2023)   PRAPARE - Administrator, Civil Service (Medical): No    Lack of Transportation (Non-Medical): No  Physical Activity: Inactive (01/09/2023)   Exercise Vital Sign    Days of  Exercise per Week: 0 days    Minutes of Exercise per Session: 0 min  Stress: No Stress Concern Present (01/09/2023)   [employer] of Occupational Health - Occupational Stress Questionnaire    Feeling of Stress : Not at all  Social Connections: Moderately Integrated (01/09/2023)   Social Connection and Isolation Panel [NHANES]    Frequency of Communication with Friends and Family: More than three times a week    Frequency of Social Gatherings with Friends and Family: More than three times a week    Attends Religious Services: More than 4 times per year    Active Member of [employer] or Organizations: Yes    Attends Banker Meetings: More than 4  times per year    Marital Status: Widowed  Intimate Partner Violence: Not At Risk (01/09/2023)   Humiliation, Afraid, Rape, and Kick questionnaire    Fear of Current or Ex-Partner: No    Emotionally Abused: No    Physically Abused: No    Sexually Abused: No    Family History: Family History  Problem Relation Age of Onset   Lupus Sister    Hypertension Sister     Review of Systems: Review of Systems  Constitutional: Negative.   Respiratory: Negative.    Cardiovascular: Negative.   Gastrointestinal: Negative.   Musculoskeletal:  Positive for back pain.  Neurological: Negative.     Physical Exam: Vital Signs BP (!) 157/76 (BP Location: Left Arm, Patient Position: Sitting, Cuff Size: Small)   Pulse 85   Ht 5\' 7"  (1.702 m)   Wt 111 lb 12.8 oz (50.7 kg)   SpO2 96%   BMI 17.51 kg/m   Physical Exam  Constitutional:      General: Not in acute distress.    Appearance: Normal appearance. Not ill-appearing.  HENT:     Head: Normocephalic and atraumatic.  Eyes:     Pupils: Pupils are equal, round Neck:     Musculoskeletal: Normal range of motion.  Cardiovascular:     Rate and Rhythm: Normal rate    Pulses: Normal pulses.  Pulmonary:     Effort: Pulmonary effort is normal. No respiratory distress.  Abdominal:      General: Abdomen is flat. There is no distension.  Musculoskeletal: Normal range of motion.  Skin:    General: Skin is warm and dry.     Findings: No erythema or rash.  Neurological:     General: No focal deficit present.     Mental Status: Alert and oriented to person, place, and time. Mental status is at baseline.     Motor: No weakness.  Psychiatric:        Mood and Affect: Mood normal.        Behavior: Behavior normal.    Assessment/Plan: The patient is scheduled for bilateral breast implant removal and bilateral capsulectomy with Dr. Ulice Bold.  Risks, benefits, and alternatives of procedure discussed, questions answered and consent obtained.    Smoking Status: Daily smoker, 3 to 4 cigarettes/day; Counseling Given?  Discussed with patient she has an increased risk of postoperative complications due to nicotine use, encouraged patient to discontinue smoking prior to surgery.  Last Mammogram: Mammogram on 12/19/2022; Results: BI-RADS 2, benign with subsequent ultrasound.  She has free silicone at the 4-5 o'clock region of the left breast.  Caprini Score: 9, high; Risk Factors include: Age, sister with history of pulmonary embolism (provoked), COPD and length of planned surgery. Recommendation for mechanical and possible pharmacological prophylaxis. Encourage early ambulation.  Will discuss possible need for postoperative Lovenox with Dr. Ulice Bold.  Discussed with patient the importance of early ambulation after surgery.  Pictures obtained: @consult   Post-op Rx sent to pharmacy: Once clearance is received from PCP, will send oxycodone, Zofran, Keflex.  Patient was provided with the General Surgical Risk consent document and Pain Medication Agreement prior to their appointment.  They had adequate time to read through the risk consent documents and Pain Medication Agreement. We also discussed them in person together during this preop appointment. All of their questions were answered to  their satisfaction.  Recommended calling if they have any further questions.  Risk consent form and Pain Medication Agreement to be scanned into patient's chart.  Will send clearance to patient's PCP to request holding aspirin 7 days prior to surgery, will also request clearance to prescribe patient postoperative pain medications as she is on Percocet tens as needed for back pain.  I did discuss with the patient that if we prescribe some postoperative pain medications it would be oxycodone 5 mg which she can take for postsurgical pain, discussed with her the importance of not mixing her chronic Percocet 10 and oxycodone 5 mg.  This is particularly important for her as she is not consistently taking the Percocet 10mg  so she likely does not have a very high tolerance/baseline  Electronically signed by: Leslee Home, PA-C 04/12/2023 11:41 AM

## 2023-04-12 NOTE — Telephone Encounter (Addendum)
Faxed surgical clearance to Gershon Crane, MD in regards to holding ASA and verifying pain management for PO  Received fax confirmation

## 2023-04-13 NOTE — Telephone Encounter (Signed)
Pt LOV was on 04/06/23 Last refill was done on 08/29/22  Please advise

## 2023-04-13 NOTE — Telephone Encounter (Signed)
Wants to add albuterol (VENTOLIN HFA) 108 (90 Base) MCG/ACT inhaler to refill request.

## 2023-04-17 ENCOUNTER — Telehealth: Payer: Self-pay | Admitting: Family Medicine

## 2023-04-17 ENCOUNTER — Telehealth: Payer: Self-pay | Admitting: Plastic Surgery

## 2023-04-17 NOTE — Telephone Encounter (Signed)
Pt called to say she is scheduled for surgery next month. Pt states she has been prescribed some medication that is very similar to what Dr. Clent Ridges has already prescribed to her.  Pt stated she is very confused and would like a call back to discuss.

## 2023-04-17 NOTE — Telephone Encounter (Signed)
Pt called and said the pharm still does not have her medication order, sx is 05-01-23

## 2023-04-20 ENCOUNTER — Telehealth: Payer: Self-pay

## 2023-04-20 NOTE — Telephone Encounter (Signed)
Spoke with pt advised that we have received the surgical clearance form and it has been placed on Dr Clent Ridges red folder to complete

## 2023-04-20 NOTE — Telephone Encounter (Signed)
Faxed 2nd request for SX clearance to Dr. Abran Cantor

## 2023-04-21 ENCOUNTER — Encounter (HOSPITAL_BASED_OUTPATIENT_CLINIC_OR_DEPARTMENT_OTHER): Payer: Self-pay | Admitting: Plastic Surgery

## 2023-04-21 ENCOUNTER — Other Ambulatory Visit: Payer: Self-pay

## 2023-04-21 ENCOUNTER — Other Ambulatory Visit: Payer: Self-pay | Admitting: Family Medicine

## 2023-04-21 DIAGNOSIS — Z1211 Encounter for screening for malignant neoplasm of colon: Secondary | ICD-10-CM

## 2023-04-21 DIAGNOSIS — Z1212 Encounter for screening for malignant neoplasm of rectum: Secondary | ICD-10-CM

## 2023-04-25 NOTE — Telephone Encounter (Signed)
Pt called to confirm whether or not we received the fax from the plastic surgery office regarding Pt's med list. Fax was located in MD's eFax folder. Pt is worried because her surgery is scheduled for Monday, so she wants to make sure her requested medication list is faxed back to them.

## 2023-04-26 ENCOUNTER — Telehealth: Payer: Self-pay

## 2023-04-26 ENCOUNTER — Telehealth: Payer: Self-pay | Admitting: Plastic Surgery

## 2023-04-26 NOTE — Telephone Encounter (Signed)
She called stating that the pharmacy didn't have her medications

## 2023-04-26 NOTE — Telephone Encounter (Signed)
Left detailed message for pt regarding this. Advised that form was received completed by Dr Clent Ridges and faxed to the Plastic surgery specialist

## 2023-04-26 NOTE — Telephone Encounter (Signed)
Called pt and adv to hold aspirin 81 mg 3 days prior to her surgery on 10/14 per [place], [place] request via secure chat:   Brooke Combs can you guys help me call the patient and let her know to hold her aspirin 81 mg 3 days before her surgery with Dr. Ulice Bold on 05/01/2023? Just received her clearance back from her PCP    Pt conveyed understanding to stop the aspirin 81 mg starting Friday, 10/11.

## 2023-04-27 MED ORDER — ONDANSETRON HCL 4 MG PO TABS: 4.0000 mg | ORAL_TABLET | Freq: Three times a day (TID) | ORAL | 0 refills | Status: DC | PRN

## 2023-04-27 MED ORDER — OXYCODONE HCL 5 MG PO TABS: 5.0000 mg | ORAL_TABLET | Freq: Four times a day (QID) | ORAL | 0 refills | Status: AC | PRN

## 2023-04-27 MED ORDER — CEPHALEXIN 500 MG PO CAPS: 500.0000 mg | ORAL_CAPSULE | Freq: Four times a day (QID) | ORAL | 0 refills | Status: AC

## 2023-04-27 NOTE — Telephone Encounter (Signed)
Post-op rx, received clearance from patient's PCP.

## 2023-04-27 NOTE — Telephone Encounter (Signed)
Spoke with pt advised that dr Clent Ridges completed the surgical clearance and was faxed to Plastic surgery office. Pt verbalized understanding

## 2023-04-28 NOTE — Anesthesia Preprocedure Evaluation (Addendum)
Anesthesia Evaluation  Patient identified by MRN, date of birth, ID band Patient awake    Reviewed: Allergy & Precautions, NPO status , Patient's Chart, lab work & pertinent test results  Airway Mallampati: I  TM Distance: >3 FB Neck ROM: Full    Dental  (+) Edentulous Upper, Edentulous Lower   Pulmonary COPD, Current Smoker and Patient abstained from smoking. 1/2ppd  Albuterol inhaler 2-3x/day   Pulmonary exam normal breath sounds clear to auscultation       Cardiovascular negative cardio ROS Normal cardiovascular exam Rhythm:Regular Rate:Normal     Neuro/Psych  PSYCHIATRIC DISORDERS Anxiety     negative neurological ROS     GI/Hepatic Neg liver ROS,GERD  Controlled and Medicated,,  Endo/Other  Hypothyroidism    Renal/GU negative Renal ROS  negative genitourinary   Musculoskeletal negative musculoskeletal ROS (+)    Abdominal   Peds  Hematology negative hematology ROS (+)   Anesthesia Other Findings   Reproductive/Obstetrics negative OB ROS                             Anesthesia Physical Anesthesia Plan  ASA: 2  Anesthesia Plan: General   Post-op Pain Management: Tylenol PO (pre-op)*   Induction: Intravenous  PONV Risk Score and Plan: 2 and Ondansetron, Dexamethasone and Treatment may vary due to age or medical condition  Airway Management Planned: LMA  Additional Equipment: None  Intra-op Plan:   Post-operative Plan: Extubation in OR  Informed Consent: I have reviewed the patients History and Physical, chart, labs and discussed the procedure including the risks, benefits and alternatives for the proposed anesthesia with the patient or authorized representative who has indicated his/her understanding and acceptance.     Dental advisory given  Plan Discussed with: CRNA  Anesthesia Plan Comments:         Anesthesia Quick Evaluation

## 2023-05-01 ENCOUNTER — Encounter (HOSPITAL_BASED_OUTPATIENT_CLINIC_OR_DEPARTMENT_OTHER): Payer: Self-pay | Admitting: Plastic Surgery

## 2023-05-01 ENCOUNTER — Ambulatory Visit (HOSPITAL_BASED_OUTPATIENT_CLINIC_OR_DEPARTMENT_OTHER): Payer: Medicare HMO | Admitting: Certified Registered"

## 2023-05-01 ENCOUNTER — Other Ambulatory Visit: Payer: Self-pay

## 2023-05-01 ENCOUNTER — Ambulatory Visit (HOSPITAL_BASED_OUTPATIENT_CLINIC_OR_DEPARTMENT_OTHER)
Admission: RE | Admit: 2023-05-01 | Discharge: 2023-05-01 | Disposition: A | Discharge status: Home / Self Care | Payer: Medicare HMO | Attending: Plastic Surgery | Admitting: Plastic Surgery

## 2023-05-01 ENCOUNTER — Encounter (HOSPITAL_BASED_OUTPATIENT_CLINIC_OR_DEPARTMENT_OTHER)
Admission: RE | Disposition: A | Discharge status: Home / Self Care | Payer: Self-pay | Source: Home / Self Care | Attending: Plastic Surgery

## 2023-05-01 DIAGNOSIS — Y812 Prosthetic and other implants, materials and accessory general- and plastic-surgery devices associated with adverse incidents: Secondary | ICD-10-CM | POA: Diagnosis not present

## 2023-05-01 DIAGNOSIS — Z86711 Personal history of pulmonary embolism: Secondary | ICD-10-CM | POA: Diagnosis not present

## 2023-05-01 DIAGNOSIS — T8543XD Leakage of breast prosthesis and implant, subsequent encounter: Secondary | ICD-10-CM | POA: Diagnosis not present

## 2023-05-01 DIAGNOSIS — T8549XA Other mechanical complication of breast prosthesis and implant, initial encounter: Secondary | ICD-10-CM | POA: Diagnosis present

## 2023-05-01 DIAGNOSIS — E039 Hypothyroidism, unspecified: Secondary | ICD-10-CM | POA: Insufficient documentation

## 2023-05-01 DIAGNOSIS — J449 Chronic obstructive pulmonary disease, unspecified: Secondary | ICD-10-CM | POA: Diagnosis not present

## 2023-05-01 DIAGNOSIS — I509 Heart failure, unspecified: Secondary | ICD-10-CM | POA: Insufficient documentation

## 2023-05-01 DIAGNOSIS — T8543XA Leakage of breast prosthesis and implant, initial encounter: Secondary | ICD-10-CM | POA: Diagnosis not present

## 2023-05-01 DIAGNOSIS — F17210 Nicotine dependence, cigarettes, uncomplicated: Secondary | ICD-10-CM | POA: Diagnosis not present

## 2023-05-01 DIAGNOSIS — K219 Gastro-esophageal reflux disease without esophagitis: Secondary | ICD-10-CM | POA: Diagnosis not present

## 2023-05-01 DIAGNOSIS — Z01818 Encounter for other preprocedural examination: Secondary | ICD-10-CM

## 2023-05-01 HISTORY — PX: BREAST IMPLANT REMOVAL: SHX5361

## 2023-05-01 HISTORY — PX: CAPSULECTOMY: SHX5381

## 2023-05-01 SURGERY — REMOVAL, IMPLANT, BREAST
Anesthesia: General | Site: Breast | Laterality: Bilateral

## 2023-05-01 MED ORDER — LIDOCAINE-EPINEPHRINE 1 %-1:100000 IJ SOLN
INTRAMUSCULAR | Status: AC
  Filled 2023-05-01: qty 1

## 2023-05-01 MED ORDER — ACETAMINOPHEN 325 MG RE SUPP: 650.0000 mg | RECTAL | Status: DC | PRN

## 2023-05-01 MED ORDER — SODIUM CHLORIDE 0.9% FLUSH: 3.0000 mL | Freq: Two times a day (BID) | INTRAVENOUS | Status: DC

## 2023-05-01 MED ORDER — LIDOCAINE 2% (20 MG/ML) 5 ML SYRINGE
INTRAMUSCULAR | Status: AC
  Filled 2023-05-01: qty 5

## 2023-05-01 MED ORDER — ONDANSETRON HCL 4 MG/2ML IJ SOLN: 4.0000 mg | Freq: Once | INTRAMUSCULAR | Status: DC | PRN

## 2023-05-01 MED ORDER — HYDROMORPHONE HCL 1 MG/ML IJ SOLN
INTRAMUSCULAR | Status: AC
  Filled 2023-05-01: qty 0.5

## 2023-05-01 MED ORDER — LIDOCAINE HCL (CARDIAC) PF 100 MG/5ML IV SOSY
PREFILLED_SYRINGE | INTRAVENOUS | Status: DC | PRN
  Administered 2023-05-01: 40 mg via INTRAVENOUS

## 2023-05-01 MED ORDER — CEFAZOLIN SODIUM-DEXTROSE 2-4 GM/100ML-% IV SOLN
2.0000 g | INTRAVENOUS | Status: AC
  Administered 2023-05-01: 2 g via INTRAVENOUS

## 2023-05-01 MED ORDER — BUPIVACAINE LIPOSOME 1.3 % IJ SUSP
INTRAMUSCULAR | Status: AC
  Filled 2023-05-01: qty 20

## 2023-05-01 MED ORDER — PROPOFOL 10 MG/ML IV BOLUS
INTRAVENOUS | Status: DC | PRN
  Administered 2023-05-01: 30 mg via INTRAVENOUS
  Administered 2023-05-01: 100 mg via INTRAVENOUS

## 2023-05-01 MED ORDER — FENTANYL CITRATE (PF) 100 MCG/2ML IJ SOLN
INTRAMUSCULAR | Status: AC
  Filled 2023-05-01: qty 2

## 2023-05-01 MED ORDER — BUPIVACAINE LIPOSOME 1.3 % IJ SUSP
INTRAMUSCULAR | Status: DC | PRN
Start: 2023-05-01 — End: 2023-05-01
  Administered 2023-05-01: 20 mL

## 2023-05-01 MED ORDER — OXYCODONE HCL 5 MG PO TABS: 5.0000 mg | ORAL_TABLET | ORAL | Status: DC | PRN

## 2023-05-01 MED ORDER — AMISULPRIDE (ANTIEMETIC) 5 MG/2ML IV SOLN
10.0000 mg | Freq: Once | INTRAVENOUS | Status: AC | PRN
  Administered 2023-05-01: 10 mg via INTRAVENOUS

## 2023-05-01 MED ORDER — ACETAMINOPHEN 500 MG PO TABS
ORAL_TABLET | ORAL | Status: AC
  Filled 2023-05-01: qty 2

## 2023-05-01 MED ORDER — SODIUM CHLORIDE 0.9% FLUSH: 3.0000 mL | INTRAVENOUS | Status: DC | PRN

## 2023-05-01 MED ORDER — CEFAZOLIN SODIUM-DEXTROSE 2-4 GM/100ML-% IV SOLN
INTRAVENOUS | Status: AC
  Filled 2023-05-01: qty 100

## 2023-05-01 MED ORDER — BUPIVACAINE-EPINEPHRINE (PF) 0.25% -1:200000 IJ SOLN
INTRAMUSCULAR | Status: AC
  Filled 2023-05-01: qty 30

## 2023-05-01 MED ORDER — SODIUM CHLORIDE 0.9 % IV SOLN: 250.0000 mL | INTRAVENOUS | Status: DC | PRN

## 2023-05-01 MED ORDER — ACETAMINOPHEN 500 MG PO TABS
1000.0000 mg | ORAL_TABLET | Freq: Once | ORAL | Status: AC
  Administered 2023-05-01: 1000 mg via ORAL

## 2023-05-01 MED ORDER — OXYCODONE HCL 5 MG PO TABS
ORAL_TABLET | ORAL | Status: AC
  Filled 2023-05-01: qty 1

## 2023-05-01 MED ORDER — PROPOFOL 10 MG/ML IV BOLUS
INTRAVENOUS | Status: AC
  Filled 2023-05-01: qty 20

## 2023-05-01 MED ORDER — AMISULPRIDE (ANTIEMETIC) 5 MG/2ML IV SOLN
INTRAVENOUS | Status: AC
  Filled 2023-05-01: qty 4

## 2023-05-01 MED ORDER — LACTATED RINGERS IV SOLN: INTRAVENOUS | Status: DC

## 2023-05-01 MED ORDER — ONDANSETRON HCL 4 MG/2ML IJ SOLN
INTRAMUSCULAR | Status: DC | PRN
  Administered 2023-05-01: 4 mg via INTRAVENOUS

## 2023-05-01 MED ORDER — ACETAMINOPHEN 325 MG PO TABS: 650.0000 mg | ORAL_TABLET | ORAL | Status: DC | PRN

## 2023-05-01 MED ORDER — DEXAMETHASONE SODIUM PHOSPHATE 10 MG/ML IJ SOLN
INTRAMUSCULAR | Status: AC
  Filled 2023-05-01: qty 1

## 2023-05-01 MED ORDER — VASHE WOUND IRRIGATION OPTIME
TOPICAL | Status: DC | PRN
  Administered 2023-05-01: 34 [oz_av]

## 2023-05-01 MED ORDER — CHLORHEXIDINE GLUCONATE CLOTH 2 % EX PADS
6.0000 | MEDICATED_PAD | Freq: Once | CUTANEOUS | Status: AC
  Administered 2023-05-01: 6 via TOPICAL

## 2023-05-01 MED ORDER — DEXAMETHASONE SODIUM PHOSPHATE 10 MG/ML IJ SOLN
INTRAMUSCULAR | Status: DC | PRN
  Administered 2023-05-01: 10 mg via INTRAVENOUS

## 2023-05-01 MED ORDER — ONDANSETRON HCL 4 MG/2ML IJ SOLN
INTRAMUSCULAR | Status: AC
  Filled 2023-05-01: qty 2

## 2023-05-01 MED ORDER — LIDOCAINE-EPINEPHRINE 1 %-1:100000 IJ SOLN
INTRAMUSCULAR | Status: DC | PRN
Start: 2023-05-01 — End: 2023-05-01
  Administered 2023-05-01: 7 mL

## 2023-05-01 MED ORDER — OXYCODONE HCL 5 MG/5ML PO SOLN: 5.0000 mg | Freq: Once | ORAL | Status: AC | PRN

## 2023-05-01 MED ORDER — HYDROMORPHONE HCL 1 MG/ML IJ SOLN
0.2500 mg | INTRAMUSCULAR | Status: DC | PRN
  Administered 2023-05-01: 0.5 mg via INTRAVENOUS

## 2023-05-01 MED ORDER — FENTANYL CITRATE (PF) 100 MCG/2ML IJ SOLN
INTRAMUSCULAR | Status: DC | PRN
  Administered 2023-05-01: 50 ug via INTRAVENOUS
  Administered 2023-05-01 (×4): 25 ug via INTRAVENOUS
  Administered 2023-05-01: 50 ug via INTRAVENOUS
  Administered 2023-05-01 (×4): 25 ug via INTRAVENOUS

## 2023-05-01 MED ORDER — OXYCODONE HCL 5 MG PO TABS
5.0000 mg | ORAL_TABLET | Freq: Once | ORAL | Status: AC | PRN
  Administered 2023-05-01: 5 mg via ORAL

## 2023-05-01 MED ORDER — FENTANYL CITRATE (PF) 100 MCG/2ML IJ SOLN: 25.0000 ug | INTRAMUSCULAR | Status: DC | PRN

## 2023-05-01 SURGICAL SUPPLY — 77 items
ADH SKN CLS APL DERMABOND .7 (GAUZE/BANDAGES/DRESSINGS) ×2
BAG DECANTER FOR FLEXI CONT (MISCELLANEOUS) ×2 IMPLANT
BINDER BREAST LRG (GAUZE/BANDAGES/DRESSINGS) IMPLANT
BINDER BREAST MEDIUM (GAUZE/BANDAGES/DRESSINGS) IMPLANT
BINDER BREAST XLRG (GAUZE/BANDAGES/DRESSINGS) IMPLANT
BINDER BREAST XXLRG (GAUZE/BANDAGES/DRESSINGS) IMPLANT
BIOPATCH RED 1 DISK 7.0 (GAUZE/BANDAGES/DRESSINGS) IMPLANT
BLADE HEX COATED 2.75 (ELECTRODE) ×2 IMPLANT
BLADE SURG 15 STRL LF DISP TIS (BLADE) ×4 IMPLANT
BLADE SURG 15 STRL SS (BLADE) ×2
BNDG GAUZE DERMACEA FLUFF 4 (GAUZE/BANDAGES/DRESSINGS) ×4 IMPLANT
BNDG GZE DERMACEA 4 6PLY (GAUZE/BANDAGES/DRESSINGS)
CANISTER SUCT 1200ML W/VALVE (MISCELLANEOUS) ×2 IMPLANT
COVER BACK TABLE 60X90IN (DRAPES) ×2 IMPLANT
COVER MAYO STAND STRL (DRAPES) ×2 IMPLANT
DERMABOND ADVANCED .7 DNX12 (GAUZE/BANDAGES/DRESSINGS) IMPLANT
DRAIN CHANNEL 15F RND FF W/TCR (WOUND CARE) IMPLANT
DRAIN CHANNEL 19F RND (DRAIN) IMPLANT
DRAPE LAPAROSCOPIC ABDOMINAL (DRAPES) ×2 IMPLANT
DRESSING MEPILEX FLEX 4X4 (GAUZE/BANDAGES/DRESSINGS) IMPLANT
DRSG MEPILEX FLEX 4X4 (GAUZE/BANDAGES/DRESSINGS) ×2
DRSG TEGADERM 2-3/8X2-3/4 SM (GAUZE/BANDAGES/DRESSINGS) IMPLANT
ELECT BLADE 4.0 EZ CLEAN MEGAD (MISCELLANEOUS) ×1
ELECT BLADE 6.5 EXT (BLADE) IMPLANT
ELECT REM PT RETURN 9FT ADLT (ELECTROSURGICAL) ×1
ELECTRODE BLDE 4.0 EZ CLN MEGD (MISCELLANEOUS) IMPLANT
ELECTRODE REM PT RTRN 9FT ADLT (ELECTROSURGICAL) ×2 IMPLANT
EVACUATOR SILICONE 100CC (DRAIN) IMPLANT
GAUZE PAD ABD 8X10 STRL (GAUZE/BANDAGES/DRESSINGS) ×4 IMPLANT
GLOVE BIO SURGEON STRL SZ 6.5 (GLOVE) ×4 IMPLANT
GLOVE BIO SURGEON STRL SZ7 (GLOVE) IMPLANT
GLOVE BIO SURGEON STRL SZ7.5 (GLOVE) IMPLANT
GLOVE BIOGEL PI IND STRL 7.0 (GLOVE) IMPLANT
GLOVE BIOGEL PI IND STRL 8 (GLOVE) IMPLANT
GLOVE SURG SS PI 7.0 STRL IVOR (GLOVE) IMPLANT
GOWN STRL REUS W/ TWL LRG LVL3 (GOWN DISPOSABLE) ×4 IMPLANT
GOWN STRL REUS W/TWL LRG LVL3 (GOWN DISPOSABLE) ×4
IV NS 1000ML (IV SOLUTION)
IV NS 1000ML BAXH (IV SOLUTION) IMPLANT
IV NS 500ML (IV SOLUTION)
IV NS 500ML BAXH (IV SOLUTION) IMPLANT
KIT FILL ASEPTIC TRANSFER (MISCELLANEOUS) IMPLANT
NDL HYPO 25X1 1.5 SAFETY (NEEDLE) ×2 IMPLANT
NDL SAFETY ECLIPSE 18X1.5 (NEEDLE) IMPLANT
NEEDLE HYPO 25X1 1.5 SAFETY (NEEDLE) ×2
NS IRRIG 1000ML POUR BTL (IV SOLUTION) ×2 IMPLANT
PACK BASIN DAY SURGERY FS (CUSTOM PROCEDURE TRAY) ×2 IMPLANT
PENCIL SMOKE EVACUATOR (MISCELLANEOUS) ×2 IMPLANT
PIN SAFETY STERILE (MISCELLANEOUS) IMPLANT
SLEEVE SCD COMPRESS KNEE MED (STOCKING) ×2 IMPLANT
SPIKE FLUID TRANSFER (MISCELLANEOUS) IMPLANT
SPONGE T-LAP 18X18 ~~LOC~~+RFID (SPONGE) ×4 IMPLANT
STAPLER VISISTAT 35W (STAPLE) IMPLANT
STRIP CLOSURE SKIN 1/2X4 (GAUZE/BANDAGES/DRESSINGS) IMPLANT
STRIP SUTURE WOUND CLOSURE 1/2 (MISCELLANEOUS) IMPLANT
SUT MNCRL AB 4-0 PS2 18 (SUTURE) ×2 IMPLANT
SUT MON AB 3-0 SH 27 (SUTURE) ×2
SUT MON AB 3-0 SH27 (SUTURE) ×2 IMPLANT
SUT MON AB 5-0 PS2 18 (SUTURE) ×4 IMPLANT
SUT PDS 3-0 CT2 (SUTURE) ×2
SUT PDS AB 2-0 CT2 27 (SUTURE) IMPLANT
SUT PDS II 3-0 CT2 27 ABS (SUTURE) IMPLANT
SUT PROLENE 3 0 PS 2 (SUTURE) IMPLANT
SUT SILK 3 0 PS 1 (SUTURE) IMPLANT
SUT VIC AB 3-0 SH 27 (SUTURE)
SUT VIC AB 3-0 SH 27X BRD (SUTURE) IMPLANT
SUT VIC AB 4-0 PS2 18 (SUTURE) IMPLANT
SWAB COLLECTION DEVICE MRSA (MISCELLANEOUS) IMPLANT
SWAB CULTURE ESWAB REG 1ML (MISCELLANEOUS) IMPLANT
SYR 50ML LL SCALE MARK (SYRINGE) IMPLANT
SYR BULB IRRIG 60ML STRL (SYRINGE) ×2 IMPLANT
SYR CONTROL 10ML LL (SYRINGE) ×2 IMPLANT
TOWEL GREEN STERILE FF (TOWEL DISPOSABLE) ×4 IMPLANT
TRAY DSU PREP LF (CUSTOM PROCEDURE TRAY) ×2 IMPLANT
TUBE CONNECTING 20X1/4 (TUBING) ×2 IMPLANT
UNDERPAD 30X36 HEAVY ABSORB (UNDERPADS AND DIAPERS) ×4 IMPLANT
YANKAUER SUCT BULB TIP NO VENT (SUCTIONS) ×2 IMPLANT

## 2023-05-01 NOTE — Anesthesia Postprocedure Evaluation (Signed)
Anesthesia Post Note  Patient: Brooke Combs  Procedure(s) Performed: REMOVAL BILATERAL BREAST IMPLANTS SILICONE (Bilateral: Breast) CAPSULECTOMY (Bilateral: Breast)     Patient location during evaluation: PACU Anesthesia Type: General Level of consciousness: awake and alert, oriented and patient cooperative Pain management: pain level controlled Vital Signs Assessment: post-procedure vital signs reviewed and stable Respiratory status: spontaneous breathing, nonlabored ventilation and respiratory function stable Cardiovascular status: blood pressure returned to baseline and stable Postop Assessment: no apparent nausea or vomiting Anesthetic complications: no   No notable events documented.  Last Vitals:  Vitals:   05/01/23 1300 05/01/23 1315  BP: (!) 145/79 137/68  Pulse: 75 76  Resp: 19 13  Temp:    SpO2: 94% 97%    Last Pain:  Vitals:   05/01/23 1315  TempSrc:   PainSc: 4                  Lannie Fields

## 2023-05-01 NOTE — Discharge Instructions (Addendum)
INSTRUCTIONS FOR AFTER BREAST SURGERY   You will likely have some questions about what to expect following your operation.  The following information will help you and your family understand what to expect when you are discharged from the hospital.  It is important to follow these guidelines to help ensure a smooth recovery and reduce complication.  Postoperative instructions include information on: diet, wound care, medications and physical activity.  AFTER SURGERY Expect to go home after the procedure.  In some cases, you may need to spend one night in the hospital for observation.  DIET Breast surgery does not require a specific diet.  However, the healthier you eat the better your body will heal. It is important to increasing your protein intake.  This means limiting the foods with sugar and carbohydrates.  Focus on vegetables and some meat.  If you have liposuction during your procedure be sure to drink water.  If your urine is bright yellow, then it is concentrated, and you need to drink more water.  As a general rule after surgery, you should have 8 ounces of water every hour while awake.  If you find you are persistently nauseated or unable to take in liquids let us know.  NO TOBACCO USE or EXPOSURE.  This will slow your healing process and lead to a wound.  WOUND CARE Leave the binder on for 3 days . Use fragrance free soap like Dial, Dove or [place].   After 3 days you can remove the binder to shower. Once dry apply binder or sports bra. If you have liposuction you will have a soft and spongy dressing (Lipofoam) that helps prevent creases in your skin.  Remove before you shower and then replace it.  It is also available on [employer]. If you have steri-strips / tape directly attached to your skin leave them in place. It is OK to get these wet.   No baths, pools or hot tubs for four weeks. We close your incision to leave the smallest and best-looking scar. No ointment or creams on your incisions  for four weeks.  No Neosporin (Too many skin reactions).  A few weeks after surgery you can use Mederma and start massaging the scar. We ask you to wear your binder or sports bra for the first 6 weeks around the clock, including while sleeping. This provides added comfort and helps reduce the fluid accumulation at the surgery site. NO Ice or heating pads to the operative site.  You have a very high risk of a BURN before you feel the temperature change.  ACTIVITY No heavy lifting until cleared by the doctor.  This usually means no more than a half-gallon of milk.  It is OK to walk and climb stairs. Moving your legs is very important to decrease your risk of a blood clot.  It will also help keep you from getting deconditioned.  Every 1 to 2 hours get up and walk for 5 minutes. This will help with a quicker recovery back to normal.  Let pain be your guide so you don't do too much.  This time is for you to recover.  You will be more comfortable if you sleep and rest with your head elevated either with a few pillows under you or in a recliner.  No stomach sleeping for a three months.  WORK Everyone returns to work at different times. As a rough guide, most people take at least 1 - 2 weeks off prior to returning to work. If  you need documentation for your job, give the forms to the front staff at the clinic.  DRIVING Arrange for someone to bring you home from the hospital after your surgery.  You may be able to drive a few days after surgery but not while taking any narcotics or valium.  BOWEL MOVEMENTS Constipation can occur after anesthesia and while taking pain medication.  It is important to stay ahead for your comfort.  We recommend taking Milk of Magnesia (2 tablespoons; twice a day) while taking the pain pills.  MEDICATIONS You may be prescribed should start after surgery At your preoperative visit for you history and physical you may have been given the following medications: An antibiotic: Start  this medication when you get home and take according to the instructions on the bottle. Zofran 4 mg:  This is to treat nausea and vomiting.  You can take this every 6 hours as needed and only if needed. Valium 2 mg for breast cancer patients: This is for muscle tightness if you have an implant or expander. This will help relax your muscle which also helps with pain control.  This can be taken every 12 hours as needed. Don't drive after taking this medication. Norco (hydrocodone/acetaminophen) 5/325 mg:  This is only to be used after you have taken the Motrin or the Tylenol. Every 8 hours as needed.   Over the counter Medication to take: Ibuprofen (Motrin) 600 mg:  Take this every 6 hours.  If you have additional pain then take 500 mg of the Tylenol every 8 hours.  Only take the Norco after you have tried these two. MiraLAX or Milk of Magnesia: Take this according to the bottle if you take the Norco.  WHEN TO CALL Call your surgeon's office if any of the following occur: Fever 101 degrees F or greater Excessive bleeding or fluid from the incision site. Pain that increases over time without aid from the medications Redness, warmth, or pus draining from incision sites Persistent nausea or inability to take in liquids Severe misshapen area that underwent the operation   Post Anesthesia Home Care Instructions  Activity: Get plenty of rest for the remainder of the day. A responsible individual must stay with you for 24 hours following the procedure.  For the next 24 hours, DO NOT: -Drive a car -Advertising copywriter -Drink alcoholic beverages -Take any medication unless instructed by your physician -Make any legal decisions or sign important papers.  Meals: Start with liquid foods such as gelatin or soup. Progress to regular foods as tolerated. Avoid greasy, spicy, heavy foods. If nausea and/or vomiting occur, drink only clear liquids until the nausea and/or vomiting subsides. Call your  physician if vomiting continues.  Special Instructions/Symptoms: Your throat may feel dry or sore from the anesthesia or the breathing tube placed in your throat during surgery. If this causes discomfort, gargle with warm salt water. The discomfort should disappear within 24 hours.  If you had a scopolamine patch placed behind your ear for the management of post- operative nausea and/or vomiting:  1. The medication in the patch is effective for 72 hours, after which it should be removed.  Wrap patch in a tissue and discard in the trash. Wash hands thoroughly with soap and water. 2. You may remove the patch earlier than 72 hours if you experience unpleasant side effects which may include dry mouth, dizziness or visual disturbances. 3. Avoid touching the patch. Wash your hands with soap and water after contact with the  patch.     About my Jackson-Pratt Bulb Drain  What is a Jackson-Pratt bulb? A Jackson-Pratt is a soft, round device used to collect drainage. It is connected to a long, thin drainage catheter, which is held in place by one or two small stiches near your surgical incision site. When the bulb is squeezed, it forms a vacuum, forcing the drainage to empty into the bulb.  Emptying the Jackson-Pratt bulb- To empty the bulb: 1. Release the plug on the top of the bulb. 2. Pour the bulb's contents into a measuring container which your nurse will provide. 3. Record the time emptied and amount of drainage. Empty the drain(s) as often as your     doctor or nurse recommends.  Date                  Time                    Amount (Drain 1)                 Amount (Drain  2)  _____________________________________________________________________  _____________________________________________________________________  _____________________________________________________________________  _____________________________________________________________________  _____________________________________________________________________  _____________________________________________________________________  _____________________________________________________________________  _____________________________________________________________________  Squeezing the Jackson-Pratt Bulb- To squeeze the bulb: 1. Make sure the plug at the top of the bulb is open. 2. Squeeze the bulb tightly in your fist. You will hear air squeezing from the bulb. 3. Replace the plug while the bulb is squeezed. 4. Use a safety pin to attach the bulb to your clothing. This will keep the catheter from     pulling at the bulb insertion site.  When to call your doctor- Call your doctor if: Drain site becomes red, swollen or hot. You have a fever greater than 101 degrees F. There is oozing at the drain site. Drain falls out (apply a guaze bandage over the drain hole and secure it with tape). Drainage increases daily not related to activity patterns. (You will usually have more drainage when you are active than when you are resting.) Drainage has a bad odor.  Information for Discharge Teaching: EXPAREL (bupivacaine liposome injectable suspension)   Pain relief is important to your recovery. The goal is to control your pain so you can move easier and return to your normal activities as soon as possible after your procedure. Your physician may use several types of medicines to manage pain, swelling, and more.  Your surgeon or anesthesiologist gave you EXPAREL(bupivacaine) to help control your pain after surgery.  EXPAREL is a local anesthetic designed to release slowly over an extended period  of time to provide pain relief by numbing the tissue around the surgical site. EXPAREL is designed to release pain medication over time and can control pain for up to 72 hours. Depending on how you respond to EXPAREL, you may require less pain medication during your recovery. EXPAREL can help reduce or eliminate the need for opioids during the first few days after surgery when pain relief is needed the most. EXPAREL is not an opioid and is not addictive. It does not cause sleepiness or sedation.   Important! A teal colored band has been placed on your arm with the date, time and amount of EXPAREL you have received. Please leave this armband in place for the full 96 hours following administration, and then you may remove the band. If you return to the hospital for any reason within 96 hours following the administration of EXPAREL, the armband provides important information that your  health care providers to know, and alerts them that you have received this anesthetic.    Possible side effects of EXPAREL: Temporary loss of sensation or ability to move in the area where medication was injected. Nausea, vomiting, constipation Rarely, numbness and tingling in your mouth or lips, lightheadedness, or anxiety may occur. Call your doctor right away if you think you may be experiencing any of these sensations, or if you have other questions regarding possible side effects.  Follow all other discharge instructions given to you by your surgeon or nurse. Eat a healthy diet and drink plenty of water or other fluids.  No tylenol until after 3:30pm today if needed.

## 2023-05-01 NOTE — Interval H&P Note (Signed)
History and Physical Interval Note:  05/01/2023 10:56 AM  Brooke Combs  has presented today for surgery, with the diagnosis of Ruptured breast implants.  The various methods of treatment have been discussed with the patient and family. After consideration of risks, benefits and other options for treatment, the patient has consented to  Procedure(s): REMOVAL BREAST IMPLANTS SILICONE (Bilateral) CAPSULECTOMY (Bilateral) as a surgical intervention.  The patient's history has been reviewed, patient examined, no change in status, stable for surgery.  I have reviewed the patient's chart and labs.  Questions were answered to the patient's satisfaction.     Alena Bills Rexford Prevo

## 2023-05-01 NOTE — Anesthesia Procedure Notes (Signed)
Procedure Name: LMA Insertion Date/Time: 05/01/2023 11:11 AM  Performed by: Lauralyn Primes, CRNAPre-anesthesia Checklist: Patient identified, Emergency Drugs available, Suction available and Patient being monitored Patient Re-evaluated:Patient Re-evaluated prior to induction Oxygen Delivery Method: Circle system utilized Preoxygenation: Pre-oxygenation with 100% oxygen Induction Type: IV induction Ventilation: Mask ventilation without difficulty LMA: LMA with gastric port inserted LMA Size: 4.0 Number of attempts: 1 Airway Equipment and Method: Bite block Placement Confirmation: positive ETCO2 Tube secured with: Tape Dental Injury: Teeth and Oropharynx as per pre-operative assessment

## 2023-05-01 NOTE — Op Note (Signed)
Op report  DATE OF OPERATION: 05/01/2023  LOCATION: [facility]  SURGICAL DIVISION: Plastic Surgery  PREOPERATIVE DIAGNOSIS:  History of bialteral Mammary hypoplasia.  2.  Bilateral Capsular contracture. 3.  Bilateral Ruptured implants.   POSTOPERATIVE DIAGNOSIS:  Same as preoperative diagnosis  PROCEDURE:  1. Bilateral removal of ruptured breast implants.  2. Bilateral complete capsulectomies.  SURGEON: Foster Simpson, DO  ASSISTANT: Evelena Leyden, PA  ANESTHESIA:  General.   COMPLICATIONS: None.   INDICATIONS FOR PROCEDURE:  The patient, Brooke Combs, is a 77 y.o. female born on February 02, 1946, is here for treatment after finding out she had ruptured silicone implants.  The implants were placed over 30 years ago. The capsule is very firm as well and will need to be removed.  MRN: [id number]  CONSENT:  Informed consent was obtained directly from the patient. Risks, benefits and alternatives were fully discussed. Specific risks including but not limited to bleeding, infection, hematoma, seroma, scarring, pain, asymmetry, wound healing problems, and need for further surgery were all discussed. The patient did have an ample opportunity to have her questions answered to her satisfaction.   DESCRIPTION OF PROCEDURE:  The patient was taken to the operating room. SCDs were placed and IV antibiotics were given. The patient's chest was prepped and draped in a sterile fashion. A time out was performed and the implants to be used were identified.    On the right breast: Local with epinephrine was used to infiltrate at the incision site. The previous inframammary incision site was utilized.  The incision was made with the #15 blade.  The bovie was used to dissect to the capsule away from the breast tissue and muscle.  The capsule was visualized.  The breast tissue was raised over the capsule and implant. The bovie was used to free the capsule from the surrounding  breast tissue. The entire implant and capsule were removed.  The pocket was irrigated with Vashe. Hemostasis was achieved with electrocautery.  New gloves were placed.  Experel was placed in the pocket A drain was placed and secured to the skin with the 4-0 Silk. The deep layer was closed with the 3-0 PDS. The remaining skin was closed with 3-0 Monocryl deep dermal and 4-0 Monocryl subcuticular stitches.   On the left breast: On the right breast: Local with epinephrine was used to infiltrate at the incision site. The previous incision site was utilized.  The incision was made with the #15 blade.  The bovie was used to dissect to the capsule and implant.  The capsule was visualized.  The breast tissue was raised over the capsule and implant. The bovie was used to free the capsule from the surrounding breast tissue. The entire implant and capsule was removed.  The pocket was irrigated with Vashe. Hemostasis was achieved with electrocautery.  Experel was placed in the pocket  New gloves were placed.  A drain was placed and secured to the skin with the 4-0 Silk. The deep layer was closed with the 3-0 PDS. The remaining skin was closed with 3-0 Monocryl deep dermal and 4-0 Monocryl subcuticular stitches. Dermabond was applied to the incision site. A breast binder and ABDs were placed.  The patient was allowed to wake from anesthesia and taken to the recovery room in satisfactory condition.   The advanced practice practitioner (APP) assisted throughout the case.  The APP was essential in retraction and counter traction when needed to make the case progress smoothly.  This retraction and  assistance made it possible to see the tissue plans for the procedure.  The assistance was needed for blood control, tissue re-approximation and assisted with closure of the incision site.

## 2023-05-01 NOTE — Transfer of Care (Signed)
Immediate Anesthesia Transfer of Care Note  Patient: Brooke Combs  Procedure(s) Performed: REMOVAL BILATERAL BREAST IMPLANTS SILICONE (Bilateral: Breast) CAPSULECTOMY (Bilateral: Breast)  Patient Location: PACU  Anesthesia Type:General  Level of Consciousness: awake, alert , and oriented  Airway & Oxygen Therapy: Patient Spontanous Breathing and Patient connected to face mask oxygen  Post-op Assessment: Report given to RN and Post -op Vital signs reviewed and stable  Post vital signs: Reviewed and stable  Last Vitals:  Vitals Value Taken Time  BP    Temp    Pulse 91 05/01/23 1228  Resp 12 05/01/23 1228  SpO2 100 % 05/01/23 1228  Vitals shown include unfiled device data.  Last Pain:  Vitals:   05/01/23 0930  TempSrc: Temporal  PainSc: 0-No pain      Patients Stated Pain Goal: 3 (05/01/23 0930)  Complications: No notable events documented.

## 2023-05-02 ENCOUNTER — Encounter (HOSPITAL_BASED_OUTPATIENT_CLINIC_OR_DEPARTMENT_OTHER): Payer: Self-pay | Admitting: Plastic Surgery

## 2023-05-02 LAB — SURGICAL PATHOLOGY

## 2023-05-03 ENCOUNTER — Telehealth: Payer: Self-pay | Admitting: Surgical

## 2023-05-03 NOTE — Telephone Encounter (Signed)
I think having someone available to drive for her for drain removal is a good idea.  Brooke Combs

## 2023-05-03 NOTE — Telephone Encounter (Signed)
Would to know if she needs anyone to accompany her after tube removal. Please reach out and let her know.

## 2023-05-05 ENCOUNTER — Ambulatory Visit (INDEPENDENT_AMBULATORY_CARE_PROVIDER_SITE_OTHER): Payer: Medicare HMO | Admitting: Student

## 2023-05-05 DIAGNOSIS — Z9889 Other specified postprocedural states: Secondary | ICD-10-CM

## 2023-05-05 DIAGNOSIS — T8543XA Leakage of breast prosthesis and implant, initial encounter: Secondary | ICD-10-CM

## 2023-05-05 NOTE — Progress Notes (Signed)
Patient is a 77 year old female who underwent bilateral removal of ruptured breast implants and complete bilateral capsulectomies on 05/01/2023 with Dr. Ulice Bold.  She is 4 days postop.  Today, patient reports she is doing really well.  She states that overall her pain is very controlled.  She states she only had to take 1 pain pill 2 days after surgery.  She states that she did feel little nauseous early on, but now she feels okay.  She states she is eating and drinking without issue.  She reports her drains have been putting out 20 cc on 1 side and 10 cc on the other.  She states that her binder feels a little bit tight.  Also is inquiring about when she can restart her aspirin.  She states that she takes it for preventative reasons.  Denies any other issues or concerns at this time.  Discussed with patient that she may transition into a compressive sports bra that Zips or clips in the front.  Discussed with her that it should provide some compression.  Patient expressed understanding.  Discussed with patient that she may restart her aspirin at this time.  Patient states she has an appointment on Monday.  She will plan to come to that appointment.  Instructed patient to call in the meantime she has any questions or concerns about anything.

## 2023-05-08 ENCOUNTER — Ambulatory Visit (INDEPENDENT_AMBULATORY_CARE_PROVIDER_SITE_OTHER): Payer: Medicare HMO | Admitting: Surgical

## 2023-05-08 VITALS — BP 152/74 | HR 77

## 2023-05-08 DIAGNOSIS — T8543XA Leakage of breast prosthesis and implant, initial encounter: Secondary | ICD-10-CM

## 2023-05-08 DIAGNOSIS — Z9889 Other specified postprocedural states: Secondary | ICD-10-CM | POA: Diagnosis not present

## 2023-05-08 NOTE — Progress Notes (Signed)
77 year old female here for follow-up of removal of bilateral breast implants with Dr. Ulice Bold on 05/01/2023.  She reports she is doing well today, she is not having any issues.  She is 1 week postop.  JP drain output has been approximately 20 cc per 24 hours.  She is not having any infectious symptoms.  Chaperone present on exam On exam bilateral JP drains are in place, serosanguineous drainage in bulbs.  There is no erythema or cellulitic changes noted of either breast.  No subcutaneous fluid collection noted palpation.  Bilateral NAC's are viable.  Breast incisions are intact.  Steri-Strips in place.  She does have some irritation within the fold of bilateral breast, suspect this is related to compression from her garment or the Mepilex border dressing.   A/P:  Patient is doing well, no signs infection or concern on exam.  She does have some irritation from compression garment or Mepilex border dressing.  Recommend continue to monitor this.  It does not appear infectious at all.  Recommend following up in approximately 2 weeks for reevaluation.  Recommend calling with questions or concerns.  Bilateral JP drains were removed, recommend Vaseline and gauze to JP drain insertion site wounds.

## 2023-05-23 ENCOUNTER — Encounter: Payer: Self-pay | Admitting: Plastic Surgery

## 2023-05-23 ENCOUNTER — Ambulatory Visit (INDEPENDENT_AMBULATORY_CARE_PROVIDER_SITE_OTHER): Payer: Medicare HMO | Admitting: Plastic Surgery

## 2023-05-23 VITALS — BP 134/70 | HR 80

## 2023-05-23 DIAGNOSIS — Z9889 Other specified postprocedural states: Secondary | ICD-10-CM

## 2023-05-23 DIAGNOSIS — T8543XA Leakage of breast prosthesis and implant, initial encounter: Secondary | ICD-10-CM

## 2023-05-23 NOTE — Progress Notes (Signed)
   Subjective:    Patient ID: Brooke Combs, female    DOB: Aug 01, 1945, 77 y.o.   MRN: [id number]  The patient is a 77 year old female here for follow-up after undergoing removal of implants that were ruptured as well as capsulectomies.  Overall she is doing really well.  There is no sign of a hematoma or infection.  She may have a seroma on the left breast so I want to keep an eye on this.  The patient denies hearing or feeling any sloshing around.  She has good symmetry.    Review of Systems  Constitutional: Negative.   HENT: Negative.    Eyes: Negative.   Respiratory: Negative.    Cardiovascular: Negative.   Gastrointestinal: Negative.   Endocrine: Negative.   Genitourinary: Negative.        Objective:   Physical Exam Constitutional:      Appearance: Normal appearance.  HENT:     Head: Atraumatic.  Cardiovascular:     Rate and Rhythm: Normal rate.     Pulses: Normal pulses.  Pulmonary:     Effort: Pulmonary effort is normal.  Skin:    General: Skin is warm.     Capillary Refill: Capillary refill takes less than 2 seconds.  Neurological:     Mental Status: She is alert and oriented to person, place, and time.  Psychiatric:        Mood and Affect: Mood normal.        Behavior: Behavior normal.        Thought Content: Thought content normal.        Assessment & Plan:  Ruptured silicone breast implant, initial encounter  Continue with sports bra but I would like her to go see the folks over at [facility].  Follow-up in 2 to 3 weeks.  Let me know if she develops any more fluid on the left breast.  Ultrasound-guided aspiration would be an option.  Pictures were obtained of the patient and placed in the chart with the patient's or guardian's permission.

## 2023-05-30 ENCOUNTER — Other Ambulatory Visit: Payer: Self-pay | Admitting: Family Medicine

## 2023-05-30 DIAGNOSIS — J40 Bronchitis, not specified as acute or chronic: Secondary | ICD-10-CM

## 2023-06-05 ENCOUNTER — Encounter: Payer: Self-pay | Admitting: Surgical

## 2023-06-05 ENCOUNTER — Ambulatory Visit (INDEPENDENT_AMBULATORY_CARE_PROVIDER_SITE_OTHER): Payer: Medicare HMO | Admitting: Surgical

## 2023-06-05 VITALS — BP 146/72 | HR 90 | Ht 63.0 in | Wt 108.4 lb

## 2023-06-05 DIAGNOSIS — Z9889 Other specified postprocedural states: Secondary | ICD-10-CM

## 2023-06-05 DIAGNOSIS — T8543XA Leakage of breast prosthesis and implant, initial encounter: Secondary | ICD-10-CM

## 2023-06-05 NOTE — Progress Notes (Signed)
Patient is a 76 year old female here for follow-up after removal of ruptured bilateral breast implants and bilateral capsulectomy with Dr. Ulice Bold on 05/01/2023  She was last seen in the clinic on 05/23/2023, there were some concerns related to a possible left breast seroma.  Patient reports she is doing really well after surgery, she does not have any specific issues or complaints at this time.  She is very happy with her surgical outcome at this point.  She feels well, has been very active, walking a lot and denies any infectious symptoms or any other concerns at this time.  Chaperone present on exam On exam bilateral NAC's are viable, bilateral breast incisions are intact and healing well.  Bilateral breasts are overall symmetric, I do not appreciate any subcutaneous fluid collection noted palpation.  She does have some creased fullness on the left side, but I do not appreciate any subcutaneous fluids or hematoma on exam.  She has no tenderness noted with palpation.  A/P:  Recommend continue with compressive garments for 1 more week 24/7, continue with activity modifications and lifting restrictions for 1 more week.  After 1 week she can increase as tolerated.  We discussed continuing with compression bra for a few more weeks throughout the day, but then can transition to a normal bra at around 8 to 10 weeks postop.  We discussed scheduling a follow-up as needed or calling with questions or concerns.  Patient was agreeable with this and will call if she has any issues or concerns.  Pictures were obtained of the patient and placed in the chart with the patient's or guardian's permission.

## 2023-06-27 DIAGNOSIS — Z1211 Encounter for screening for malignant neoplasm of colon: Secondary | ICD-10-CM | POA: Diagnosis not present

## 2023-06-27 DIAGNOSIS — Z1212 Encounter for screening for malignant neoplasm of rectum: Secondary | ICD-10-CM | POA: Diagnosis not present

## 2023-07-04 ENCOUNTER — Telehealth: Payer: Self-pay | Admitting: Family Medicine

## 2023-07-04 DIAGNOSIS — R195 Other fecal abnormalities: Secondary | ICD-10-CM

## 2023-07-04 LAB — COLOGUARD: COLOGUARD: POSITIVE — AB

## 2023-07-04 NOTE — Telephone Encounter (Signed)
Done

## 2023-07-10 ENCOUNTER — Ambulatory Visit (INDEPENDENT_AMBULATORY_CARE_PROVIDER_SITE_OTHER): Payer: Medicare HMO | Admitting: Family Medicine

## 2023-07-10 ENCOUNTER — Encounter: Payer: Self-pay | Admitting: Family Medicine

## 2023-07-10 VITALS — BP 128/72 | HR 86 | Temp 98.1°F | Wt 112.6 lb

## 2023-07-10 DIAGNOSIS — E782 Mixed hyperlipidemia: Secondary | ICD-10-CM

## 2023-07-10 DIAGNOSIS — E039 Hypothyroidism, unspecified: Secondary | ICD-10-CM

## 2023-07-10 LAB — T3, FREE: T3, Free: 3.9 pg/mL (ref 2.3–4.2)

## 2023-07-10 LAB — LIPID PANEL
Cholesterol: 167 mg/dL (ref 0–200)
HDL: 45.8 mg/dL (ref >39.00)
LDL Cholesterol: 98 mg/dL (ref 0–99)
NonHDL: 121.66
Total CHOL/HDL Ratio: 4
Triglycerides: 117 mg/dL (ref 0.0–149.0)
VLDL: 23.4 mg/dL (ref 0.0–40.0)

## 2023-07-10 LAB — TSH: TSH: 0.02 u[IU]/mL — ABNORMAL LOW (ref 0.35–5.50)

## 2023-07-10 LAB — T4, FREE: Free T4: 1.54 ng/dL (ref 0.60–1.60)

## 2023-07-10 MED ORDER — OXYCODONE-ACETAMINOPHEN 10-325 MG PO TABS
1.0000 | ORAL_TABLET | ORAL | 0 refills | Status: DC | PRN
Start: 2023-07-10 — End: 2023-10-23

## 2023-07-10 NOTE — Progress Notes (Signed)
   Subjective:    Patient ID: Brooke Combs, female    DOB: 05/23/1946, 77 y.o.   MRN: [id number]  HPI Here to follow up on hypothyroidism and dyslipidemia. She feels great, and her surgery in October to remove her breast implants went very well. She still watches her diet carefully.    Review of Systems  Constitutional: Negative.   Respiratory: Negative.    Cardiovascular: Negative.        Objective:   Physical Exam Constitutional:      Appearance: Normal appearance.  Cardiovascular:     Rate and Rhythm: Normal rate and regular rhythm.     Pulses: Normal pulses.     Heart sounds: Normal heart sounds.  Pulmonary:     Effort: Pulmonary effort is normal.     Breath sounds: Normal breath sounds.  Neurological:     Mental Status: She is alert.           Assessment & Plan:  Hypothyroidism and dyslipidemia. We will check fasting labs today.  Gershon Crane, MD

## 2023-08-16 ENCOUNTER — Other Ambulatory Visit: Payer: Self-pay | Admitting: Family Medicine

## 2023-08-18 ENCOUNTER — Other Ambulatory Visit: Payer: Self-pay | Admitting: Family Medicine

## 2023-08-18 NOTE — Telephone Encounter (Signed)
Copied from CRM [id number]. Topic: Clinical - Medication Refill >> Aug 18, 2023 11:13 AM Larwance Sachs wrote: Most Recent Primary Care Visit:  Provider: Gershon Crane A  Department: [facility]  Visit Type: OFFICE VISIT  Date: 07/10/2023  Medication: simvastatin (ZOCOR) 40 MG tablet  Has the patient contacted their pharmacy? Yes (Agent: If no, request that the patient contact the pharmacy for the refill. If patient does not wish to contact the pharmacy document the reason why and proceed with request.) (Agent: If yes, when and what did the pharmacy advise?)  Is this the correct pharmacy for this prescription? Yes If no, delete pharmacy and type the correct one.  This is the patient's preferred pharmacy:  [phone], [place] - [id number] [name] [address] Phone: [phone] Fax: [phone]   Has the prescription been filled recently? No  Is the patient out of the medication? No  Has the patient been seen for an appointment in the last year OR does the patient have an upcoming appointment? Yes  Can we respond through MyChart? No  Agent: Please be advised that Rx refills may take up to 3 business days. We ask that you follow-up with your pharmacy.

## 2023-08-22 ENCOUNTER — Encounter: Payer: Self-pay | Admitting: Family Medicine

## 2023-08-22 ENCOUNTER — Ambulatory Visit (INDEPENDENT_AMBULATORY_CARE_PROVIDER_SITE_OTHER): Payer: Medicare HMO | Admitting: Family Medicine

## 2023-08-22 VITALS — BP 118/62 | HR 85 | Temp 98.2°F | Wt 109.4 lb

## 2023-08-22 DIAGNOSIS — R079 Chest pain, unspecified: Secondary | ICD-10-CM

## 2023-08-22 DIAGNOSIS — E538 Deficiency of other specified B group vitamins: Secondary | ICD-10-CM | POA: Diagnosis not present

## 2023-08-22 DIAGNOSIS — R739 Hyperglycemia, unspecified: Secondary | ICD-10-CM | POA: Diagnosis not present

## 2023-08-22 DIAGNOSIS — G629 Polyneuropathy, unspecified: Secondary | ICD-10-CM | POA: Diagnosis not present

## 2023-08-22 DIAGNOSIS — K219 Gastro-esophageal reflux disease without esophagitis: Secondary | ICD-10-CM

## 2023-08-22 LAB — HEMOGLOBIN A1C: Hgb A1c MFr Bld: 5.6 % (ref 4.6–6.5)

## 2023-08-22 LAB — VITAMIN B12: Vitamin B-12: 401 pg/mL (ref 211–911)

## 2023-08-22 NOTE — Progress Notes (Signed)
   Subjective:    Patient ID: Brooke Combs, female    DOB: 1946-05-22, 78 y.o.   MRN: [id number]  HPI Here for 2 issues. First she has had episodes of pressure in the chest which radiates up into the neck and jaws for the past year. She has not told us  about these before today. These happened once or twice a month for some time, but in the last month they are happening once or twice a week. They are not associated with exertion. No SOB or sweats. They last 30- 60 minutes at a time. She has a long hx of GERD, but she takes Nexium  daily, and she thinks these chest pains are different. Also she describes numbness and tingling in the feet which started about 3 months ago. This comes and goes. No pain or swelling. She had a SPECT Myoview  study in 2019 that was normal.    Review of Systems  Constitutional: Negative.   Respiratory: Negative.    Cardiovascular:  Positive for chest pain. Negative for palpitations and leg swelling.  Gastrointestinal: Negative.   Genitourinary: Negative.   Neurological:  Positive for numbness. Negative for weakness.       Objective:   Physical Exam Constitutional:      Appearance: Normal appearance. She is not ill-appearing.  Cardiovascular:     Rate and Rhythm: Normal rate and regular rhythm.     Pulses: Normal pulses.     Heart sounds: Normal heart sounds.     Comments: The feet are pink and warm with intact PT and DP pulses . Her EKG today is normal.  Pulmonary:     Effort: Pulmonary effort is normal.     Breath sounds: Normal breath sounds.  Musculoskeletal:     Right lower leg: No edema.     Left lower leg: No edema.  Neurological:     Mental Status: She is alert.           Assessment & Plan:  She is having chest pains that could be related to GERD, but I am concerned they could be cardiac related. She will increase the Nexium  back up to 40 mg BID. We will refer her urgently to Cardiology to evaluate. 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 We will check an A1c and a B12 level to look for possible etiology. We elected not to treat this right now.  Garnette Olmsted, MD

## 2023-09-26 ENCOUNTER — Telehealth: Payer: Self-pay

## 2023-09-26 NOTE — Telephone Encounter (Signed)
 Copied from CRM [id number]. Topic: Referral - Status >> Sep 26, 2023 10:36 AM Fonda Kinder J wrote: Reason for CRM: Pt states she was referred to Cardio for acid reflux but she was advised to double her dosage of esomeprazole (NEXIUM) 20 MG first to see if it would help. The pt states the double dosage has improved the acid reflux  and she has cancelled her cardio appointment

## 2023-10-12 ENCOUNTER — Other Ambulatory Visit: Payer: Self-pay | Admitting: Family Medicine

## 2023-10-12 DIAGNOSIS — J40 Bronchitis, not specified as acute or chronic: Secondary | ICD-10-CM

## 2023-10-14 ENCOUNTER — Other Ambulatory Visit: Payer: Self-pay | Admitting: Family Medicine

## 2023-10-17 NOTE — Telephone Encounter (Signed)
 Pt LOV was on 08/22/23 Last refill was done on 04/14/23 Please advise

## 2023-10-19 ENCOUNTER — Ambulatory Visit: Payer: Medicare HMO | Admitting: Internal Medicine

## 2023-10-23 ENCOUNTER — Other Ambulatory Visit: Payer: Self-pay | Admitting: Family Medicine

## 2023-10-23 ENCOUNTER — Ambulatory Visit (INDEPENDENT_AMBULATORY_CARE_PROVIDER_SITE_OTHER): Admitting: Family Medicine

## 2023-10-23 ENCOUNTER — Ambulatory Visit: Admitting: Family Medicine

## 2023-10-23 ENCOUNTER — Encounter: Payer: Self-pay | Admitting: Family Medicine

## 2023-10-23 VITALS — BP 132/80 | HR 84 | Temp 98.3°F | Wt 108.0 lb

## 2023-10-23 DIAGNOSIS — J3089 Other allergic rhinitis: Secondary | ICD-10-CM | POA: Diagnosis not present

## 2023-10-23 DIAGNOSIS — K219 Gastro-esophageal reflux disease without esophagitis: Secondary | ICD-10-CM

## 2023-10-23 DIAGNOSIS — J439 Emphysema, unspecified: Secondary | ICD-10-CM | POA: Diagnosis not present

## 2023-10-23 DIAGNOSIS — G8929 Other chronic pain: Secondary | ICD-10-CM | POA: Diagnosis not present

## 2023-10-23 DIAGNOSIS — M5441 Lumbago with sciatica, right side: Secondary | ICD-10-CM | POA: Diagnosis not present

## 2023-10-23 MED ORDER — OXYCODONE-ACETAMINOPHEN 10-325 MG PO TABS: 1.0000 | ORAL_TABLET | ORAL | 0 refills | Status: DC | PRN

## 2023-10-23 MED ORDER — ESOMEPRAZOLE MAGNESIUM 40 MG PO CPDR
40.0000 mg | DELAYED_RELEASE_CAPSULE | Freq: Two times a day (BID) | ORAL | 3 refills | Status: AC
Start: 2023-10-23 — End: ?

## 2023-10-23 MED ORDER — METHYLPREDNISOLONE 4 MG PO TBPK: ORAL_TABLET | ORAL | 0 refills | Status: DC

## 2023-10-23 NOTE — Progress Notes (Signed)
   Subjective:    Patient ID: Brooke Combs, female    DOB: Nov 02, 1945, 78 y.o.   MRN: [id number]  HPI Here for several issues. At our last visit she was complaining of intermittent chest pressure that radiated up to her jaws. We felt this was likely due to GERD, so we increased her Nexium to 40 mg BID. We also referred her to Cardiology. However once she increased the Nexium, all her chest discomfort resolved, so she cancelled the Cardiology appt. Today she also describes a flare of her allergies, causing itchy eyes and a dry cough. She is taking Zyrtec with mixed results. She uses her Albuterol inhaler as needed. Lastly she has had several weeks of sharp pains in the right lower back that radiate to the right buttock. She thinks this started after she lifted a heavy drainage hose that is attached to her RV.    Review of Systems  Constitutional: Negative.   HENT:  Positive for congestion and postnasal drip. Negative for ear pain, sinus pain and sore throat.   Eyes:  Positive for itching.  Respiratory:  Positive for cough. Negative for shortness of breath and wheezing.   Cardiovascular: Negative.   Musculoskeletal:  Positive for back pain.       Objective:   Physical Exam Constitutional:      Appearance: Normal appearance. She is not ill-appearing.  HENT:     Right Ear: Tympanic membrane, ear canal and external ear normal.     Left Ear: Tympanic membrane, ear canal and external ear normal.     Nose: Nose normal.     Mouth/Throat:     Pharynx: Oropharynx is clear.  Eyes:     Conjunctiva/sclera: Conjunctivae normal.  Cardiovascular:     Rate and Rhythm: Normal rate and regular rhythm.     Pulses: Normal pulses.     Heart sounds: Normal heart sounds.  Pulmonary:     Breath sounds: Normal breath sounds. No wheezing, rhonchi or rales.  Musculoskeletal:     Comments: She is mildly tender in the right lower back and right sciatic notch. ROM is full. SLR are negative.    Lymphadenopathy:     Cervical: No cervical adenopathy.  Neurological:     Mental Status: She is alert.           Assessment & Plan:  Her chest tightness was due to GERD, so she will stay on Nexium 40 mg BID. For her allergies, she will try a Medrol dose pack. For the low back pain, we will send her some more Percocet to use as needed. The Medrol dose pack should help with this as well.  Gershon Crane, MD

## 2023-11-01 ENCOUNTER — Telehealth: Payer: Self-pay

## 2023-11-01 ENCOUNTER — Other Ambulatory Visit (HOSPITAL_COMMUNITY): Payer: Self-pay

## 2023-11-01 NOTE — Telephone Encounter (Signed)
 PA has been submitted and documented in separate encounter, please sign off on rx in this encounter as PA team is unable to resolve RX requests. Thank you

## 2023-11-01 NOTE — Telephone Encounter (Signed)
 Pharmacy Patient Advocate Encounter   Received notification from RX Request Messages that prior authorization for Esomeprazole Magnesium 40MG  dr capsules is required/requested.   Insurance verification completed.   The patient is insured through [facility] .   Per test claim: PA required; PA submitted to above mentioned insurance via CoverMyMeds Key/confirmation #/EOC [id number] Status is pending

## 2023-11-02 ENCOUNTER — Other Ambulatory Visit (HOSPITAL_COMMUNITY): Payer: Self-pay

## 2023-11-02 NOTE — Telephone Encounter (Signed)
 Pharmacy Patient Advocate Encounter  Received notification from [facility] [employer] that Prior Authorization for Esomeprazole Magnesium 40MG  dr capsules  has been APPROVED from 07/19/23 to 07/17/24. Unable to obtain price due to refill too soon rejection, last fill date 11/01/23 next available fill date06/23/25   PA #/Case ID/Reference #: [id number]

## 2023-11-06 NOTE — Telephone Encounter (Signed)
 Contacted pharmacy to follow up.   Pharmacy reports pt had picked up the Esomeprazole  Rx on 11/02/2023 with Co-pay $15 for 90 days supply.   He reports that pt did not have any problem.

## 2023-11-11 ENCOUNTER — Other Ambulatory Visit: Payer: Self-pay | Admitting: Family Medicine

## 2023-12-03 ENCOUNTER — Other Ambulatory Visit: Payer: Self-pay | Admitting: Family Medicine

## 2023-12-03 DIAGNOSIS — J40 Bronchitis, not specified as acute or chronic: Secondary | ICD-10-CM

## 2023-12-18 ENCOUNTER — Ambulatory Visit: Payer: Self-pay

## 2023-12-18 NOTE — Telephone Encounter (Signed)
  Chief Complaint: neck/shoulder pain Symptoms: left sided neck pain, left shoulder swelling and pain Frequency: x 3-4 days Pertinent Negatives: Patient denies weakness or numbness down arms or legs, headaches, chest pain, SOB, loss of bowel or bladder control Disposition: [] ED /[] Urgent Care (no appt availability in office) / [x] Appointment(In office/virtual)/ []  [place] [facility]/ [] Home Care/ [] Refused Recommended Disposition /[] [place] [facility]/ []  Follow-up with PCP Additional Notes: Patient states she has had issues with back pain and sciatica before. She is unsure of any injury to her neck or shoulder. Patient agreeable to acute visit with PCP on Wednesday.  Copied from CRM [id number]. Topic: Clinical - Red Word Triage >> Dec 18, 2023 12:47 PM Martinique E wrote: [facility] that prompted transfer to Nurse Triage: Swollen left shoulder with pain. Patient stated this has been going on awhile but now there is pain in her neck when she turns her head. Reason for Disposition  [1] MODERATE neck pain (e.g., interferes with normal activities) AND [2] present > 3 days  Answer Assessment - Initial Assessment Questions 1. ONSET: "When did the pain begin?"      X 3-4 days.  2. LOCATION: "Where does it hurt?"      Left neck.  3. PATTERN "Does the pain come and go, or has it been constant since it started?"      Constant.  4. SEVERITY: "How bad is the pain?"  (Scale 1-10; or mild, moderate, severe)   - NO PAIN (0): no pain or only slight stiffness    - MILD (1-3): doesn't interfere with normal activities    - MODERATE (4-7): interferes with normal activities or awakens from sleep    - SEVERE (8-10):  excruciating pain, unable to do any normal activities      Moderate. Took half a Percocet.  5. RADIATION: "Does the pain go anywhere else, shoot into your arms?"     Into left shoulder.  6. CORD SYMPTOMS: "Any weakness or numbness of the arms or legs?"     No.  7. CAUSE: "What do you  think is causing the neck pain?"     Unsure.  8. NECK OVERUSE: "Any recent activities that involved turning or twisting the neck?"     No.  9. OTHER SYMPTOMS: "Do you have any other symptoms?" (e.g., headache, fever, chest pain, difficulty breathing, neck swelling)     Left shoulder pain and swelling.  10. PREGNANCY: "Is there any chance you are pregnant?" "When was your last menstrual period?"       N/A.  Protocols used: Neck Pain or Stiffness-A-AH

## 2023-12-20 ENCOUNTER — Ambulatory Visit (INDEPENDENT_AMBULATORY_CARE_PROVIDER_SITE_OTHER): Admitting: Family Medicine

## 2023-12-20 ENCOUNTER — Ambulatory Visit

## 2023-12-20 ENCOUNTER — Other Ambulatory Visit: Payer: Self-pay | Admitting: Family Medicine

## 2023-12-20 ENCOUNTER — Encounter: Payer: Self-pay | Admitting: Family Medicine

## 2023-12-20 VITALS — BP 120/64 | HR 74 | Temp 98.4°F | Wt 107.8 lb

## 2023-12-20 DIAGNOSIS — M542 Cervicalgia: Secondary | ICD-10-CM

## 2023-12-20 DIAGNOSIS — M4802 Spinal stenosis, cervical region: Secondary | ICD-10-CM | POA: Diagnosis not present

## 2023-12-20 DIAGNOSIS — M47812 Spondylosis without myelopathy or radiculopathy, cervical region: Secondary | ICD-10-CM | POA: Diagnosis not present

## 2023-12-20 MED ORDER — OXYCODONE-ACETAMINOPHEN 10-325 MG PO TABS: 1.0000 | ORAL_TABLET | ORAL | 0 refills | Status: DC | PRN

## 2023-12-20 MED ORDER — METHOCARBAMOL 500 MG PO TABS: 500.0000 mg | ORAL_TABLET | Freq: Four times a day (QID) | ORAL | 2 refills | Status: DC | PRN

## 2023-12-20 NOTE — Telephone Encounter (Signed)
 Pt had a visit today with Dr Mabel Savage this

## 2023-12-20 NOTE — Progress Notes (Signed)
   Subjective:    Patient ID: Brooke Combs, female    DOB: September 01, 1945, 78 y.o.   MRN: [id number]  HPI Here for 2 weeks of sharp pain in the left posterior neck that radiates to the left shoulder. She has pain and spasms in the muscles in between. Using heat and Percocet as needed. No recent trauma. No weakness or numbness in the arm or hand .   Review of Systems  Constitutional: Negative.   Respiratory: Negative.    Cardiovascular: Negative.   Musculoskeletal:  Positive for neck pain and neck stiffness.       Objective:   Physical Exam Constitutional:      General: She is not in acute distress.    Appearance: Normal appearance.  Cardiovascular:     Rate and Rhythm: Normal rate and regular rhythm.     Pulses: Normal pulses.     Heart sounds: Normal heart sounds.  Pulmonary:     Effort: Pulmonary effort is normal.     Breath sounds: Normal breath sounds.  Musculoskeletal:     Comments: She is tender along the left posterior neck and over the upper trapezius. There is spasm in this area   Neurological:     Mental Status: She is alert.           Assessment & Plan:  Left neck pain, likely the result of a pinched nerve. We will get Xrays of the cervical spine today. She can continue using heat and Percocet as needed, and we will add Methocarbamol QID. Corita Diego, MD

## 2023-12-21 NOTE — Telephone Encounter (Signed)
 Done

## 2023-12-25 ENCOUNTER — Ambulatory Visit: Payer: Self-pay

## 2023-12-25 ENCOUNTER — Encounter: Payer: Self-pay | Admitting: Family Medicine

## 2023-12-25 ENCOUNTER — Ambulatory Visit (INDEPENDENT_AMBULATORY_CARE_PROVIDER_SITE_OTHER): Admitting: Family Medicine

## 2023-12-25 VITALS — BP 110/60 | HR 78 | Temp 98.5°F | Wt 107.0 lb

## 2023-12-25 DIAGNOSIS — G8929 Other chronic pain: Secondary | ICD-10-CM | POA: Diagnosis not present

## 2023-12-25 DIAGNOSIS — M5441 Lumbago with sciatica, right side: Secondary | ICD-10-CM

## 2023-12-25 NOTE — Progress Notes (Signed)
   Subjective:    Patient ID: Brooke Combs, female    DOB: 1946/06/16, 78 y.o.   MRN: [id number]  HPI Here for 3 days of pain in the right lower back that shoots down the right leg. She has a history of this. The pain started again over the weekend when she knelt down to help a friend who had fallen, and then she stood up. When she stood up a sharp pain started. She is using heat and Tizanidine and Percocet as needed.    Review of Systems  Constitutional: Negative.   Respiratory: Negative.    Cardiovascular: Negative.   Musculoskeletal:  Positive for back pain.       Objective:   Physical Exam Constitutional:      Appearance: Normal appearance.  Cardiovascular:     Rate and Rhythm: Normal rate and regular rhythm.     Pulses: Normal pulses.     Heart sounds: Normal heart sounds.  Pulmonary:     Effort: Pulmonary effort is normal.     Breath sounds: Normal breath sounds.  Musculoskeletal:     Comments: She is very tender on the right lumbar area and over the right sciatic notch. Negative SLR  Neurological:     Mental Status: She is alert.           Assessment & Plan:  Low back pain with sciatica. We will refer her to Orthopedics to evaluate. Corita Diego, MD

## 2023-12-25 NOTE — Telephone Encounter (Signed)
 Noted

## 2023-12-25 NOTE — Telephone Encounter (Signed)
 FYI Only or Action Required?: FYI only for provider  Patient was last seen in primary care on 12/20/2023 by Donley Furth, MD. Called Nurse Triage reporting Back Pain. Symptoms began several days ago. Interventions attempted: Prescription medications: hydrocodone . Symptoms are: gradually worsening.  Triage Disposition: No disposition on file.  Patient/caregiver understands and will follow disposition?:   Copied from CRM [id number]. Topic: Clinical - Red Word Triage >> Dec 25, 2023  8:35 AM Ivette P wrote: Red Word that prompted transfer to Nurse Triage:  Neck and shoulder, bad nerve on the right side back. Pt said currently experiencing back pain, sciatic nerve causing pain. Reason for Disposition  [1] MODERATE back pain (e.g., interferes with normal activities) AND [2] present > 3 days  Answer Assessment - Initial Assessment Questions 1. ONSET: "When did the pain begin?"      Saturday; states bad sciatic nerve on right 2. LOCATION: "Where does it hurt?" (upper, mid or lower back)     Lower back 3. SEVERITY: "How bad is the pain?"  (e.g., Scale 1-10; mild, moderate, or severe)   - MILD (1-3): Doesn't interfere with normal activities.    - MODERATE (4-7): Interferes with normal activities or awakens from sleep.    - SEVERE (8-10): Excruciating pain, unable to do any normal activities.      severe 4. PATTERN: "Is the pain constant?" (e.g., yes, no; constant, intermittent)      Comes and goes 5. RADIATION: "Does the pain shoot into your legs or somewhere else?"     Across the back 6. CAUSE:  "What do you think is causing the back pain?"      sciatic 7. BACK OVERUSE:  "Any recent lifting of heavy objects, strenuous work or exercise?"     States turned  8. MEDICINES: "What have you taken so far for the pain?" (e.g., nothing, acetaminophen , NSAIDS)     hydrocodone  9. NEUROLOGIC SYMPTOMS: "Do you have any weakness, numbness, or problems with bowel/bladder control?"     no 10. OTHER SYMPTOMS:  "Do you have any other symptoms?" (e.g., fever, abdomen pain, burning with urination, blood in urine)       no 11. PREGNANCY: "Is there any chance you are pregnant?" "When was your last menstrual period?"       na  Protocols used: Back Pain-A-AH

## 2023-12-26 ENCOUNTER — Ambulatory Visit: Payer: Self-pay | Admitting: Family Medicine

## 2024-01-17 DIAGNOSIS — M5441 Lumbago with sciatica, right side: Secondary | ICD-10-CM | POA: Diagnosis not present

## 2024-01-17 DIAGNOSIS — G8929 Other chronic pain: Secondary | ICD-10-CM | POA: Diagnosis not present

## 2024-01-17 DIAGNOSIS — M5459 Other low back pain: Secondary | ICD-10-CM | POA: Diagnosis not present

## 2024-01-17 DIAGNOSIS — S32000A Wedge compression fracture of unspecified lumbar vertebra, initial encounter for closed fracture: Secondary | ICD-10-CM | POA: Diagnosis not present

## 2024-01-20 DIAGNOSIS — M545 Low back pain, unspecified: Secondary | ICD-10-CM | POA: Diagnosis not present

## 2024-01-22 DIAGNOSIS — S32000A Wedge compression fracture of unspecified lumbar vertebra, initial encounter for closed fracture: Secondary | ICD-10-CM | POA: Diagnosis not present

## 2024-01-22 DIAGNOSIS — G8929 Other chronic pain: Secondary | ICD-10-CM | POA: Diagnosis not present

## 2024-01-22 DIAGNOSIS — M5459 Other low back pain: Secondary | ICD-10-CM | POA: Diagnosis not present

## 2024-01-22 DIAGNOSIS — M5441 Lumbago with sciatica, right side: Secondary | ICD-10-CM | POA: Diagnosis not present

## 2024-01-28 ENCOUNTER — Other Ambulatory Visit: Payer: Self-pay | Admitting: Family Medicine

## 2024-01-28 DIAGNOSIS — J40 Bronchitis, not specified as acute or chronic: Secondary | ICD-10-CM

## 2024-01-30 NOTE — Telephone Encounter (Unsigned)
 Copied from CRM [id number]. Topic: Clinical - Lab/Test Results >> Jan 29, 2024 10:15 AM Drema MATSU wrote: Reason for CRM: Patient wants to let Dr. Johnny know the results of her MRI. She stated that she in fact did fractured her L1 vertebrae.

## 2024-02-07 ENCOUNTER — Other Ambulatory Visit: Payer: Self-pay | Admitting: Family Medicine

## 2024-02-19 ENCOUNTER — Telehealth: Payer: Self-pay

## 2024-02-19 DIAGNOSIS — M8000XS Age-related osteoporosis with current pathological fracture, unspecified site, sequela: Secondary | ICD-10-CM

## 2024-02-19 NOTE — Addendum Note (Signed)
 Addended by: JOHNNY SENIOR A on: 02/19/2024 01:04 PM   Modules accepted: Orders

## 2024-02-19 NOTE — Telephone Encounter (Signed)
 Copied from CRM [id number]. Topic: Clinical - Lab/Test Results >> Feb 19, 2024 11:08 AM Frederich PARAS wrote: Reason for CRM: christina from [facility] calling she says bone density, its covered at 100% one every 2 yrs, it is recommended in 6mos of the fracture. Pt wants a bone density lab ordered. Tawni # is [phone] if you have any questions

## 2024-02-19 NOTE — Telephone Encounter (Signed)
 Left  detailed message for pt regarding Dexa scan, advise to call the office with any questions

## 2024-02-19 NOTE — Telephone Encounter (Signed)
 Spoke with pt advised that I will call her tomorrow with information regarding which imaging place her referral was sent to.

## 2024-02-19 NOTE — Telephone Encounter (Signed)
I ordered the DEXA.

## 2024-02-20 NOTE — Telephone Encounter (Signed)
 Dexa scan order given to the front office Cheri to schedule pt for appointment

## 2024-02-21 ENCOUNTER — Telehealth: Payer: Self-pay

## 2024-02-21 NOTE — Telephone Encounter (Signed)
 Copied from CRM #[id number]. Topic: General - Other >> Feb 20, 2024  3:46 PM Burnard DEL wrote: Reason for CRM: Patient was advised by [facility] radiology where she is going to have her bone density test done on 02/26/2024 that her PCP office is responsible for letting her know  if they will cover the bone density test. Please advise patient as soon as possible if this information is accurate.

## 2024-02-21 NOTE — Telephone Encounter (Signed)
 I have no idea if this covered. I've never been asked this before, but I would assume so

## 2024-02-22 NOTE — Telephone Encounter (Signed)
 Spoke with pt advised to contact her insurance regarding her bone density, pt voiced understanding

## 2024-02-26 ENCOUNTER — Inpatient Hospital Stay: Admission: RE | Admit: 2024-02-26 | Source: Ambulatory Visit

## 2024-02-26 ENCOUNTER — Telehealth: Payer: Self-pay

## 2024-02-26 NOTE — Telephone Encounter (Signed)
 Copied from CRM #[id number]. Topic: General - Other >> Feb 20, 2024  3:46 PM Burnard DEL wrote: Reason for CRM: Patient was advised by [facility] radiology where she is going to have her bone density test done on 02/26/2024 that her PCP office is responsible for letting her know  if they will cover the bone density test. Please advise patient as soon as possible if this information is accurate. >> Feb 26, 2024  9:12 AM Martinique E wrote: Patient called back regarding this and would like to speak with Inocente about her bone density appointment.

## 2024-02-27 NOTE — Telephone Encounter (Signed)
 Pt will reach out to [employer] to see if bone density is covered

## 2024-02-27 NOTE — Telephone Encounter (Signed)
 Lmom asking pt callback and she cancel her bmd and I was calling to see if she would like to make an appt

## 2024-02-27 NOTE — Telephone Encounter (Signed)
 Pt will contact [employer] before scheduling bone density

## 2024-02-27 NOTE — Telephone Encounter (Signed)
 Pt will contact [employer] to make sure they will cover bmd

## 2024-02-28 NOTE — Telephone Encounter (Signed)
 Sent this message to [facility] referral coordinator

## 2024-02-29 ENCOUNTER — Telehealth: Payer: Self-pay

## 2024-02-29 NOTE — Telephone Encounter (Signed)
 Returned Christina's call from [place], left detailed message to return my call in the office Copied from CRM [id number]. Topic: Referral - Question >> Feb 27, 2024  9:40 AM Revonda D wrote: Reason for CRM: Tawni with [facility] is requesting to speak with In[facility] in regards to the pt's bone density scan referral. She would like a callback today if possible [phone].

## 2024-03-04 ENCOUNTER — Telehealth: Payer: Self-pay

## 2024-03-04 NOTE — Telephone Encounter (Signed)
 2nd Attempt to call Tawni with no success, previously left detailed message with the reason for the call with no call back Copied from CRM #[id number]. Topic: General - Other >> Mar 01, 2024  1:56 PM Thersia BROCKS wrote: Reason for CRM: Tawni from [employer] back , would like a callback   [phone]

## 2024-03-13 ENCOUNTER — Ambulatory Visit (INDEPENDENT_AMBULATORY_CARE_PROVIDER_SITE_OTHER)
Admission: RE | Admit: 2024-03-13 | Discharge: 2024-03-13 | Disposition: A | Discharge status: Home / Self Care | Source: Ambulatory Visit | Attending: Family Medicine | Admitting: Family Medicine

## 2024-03-13 DIAGNOSIS — M8000XS Age-related osteoporosis with current pathological fracture, unspecified site, sequela: Secondary | ICD-10-CM

## 2024-03-13 DIAGNOSIS — Z13820 Encounter for screening for osteoporosis: Secondary | ICD-10-CM | POA: Diagnosis not present

## 2024-03-19 ENCOUNTER — Ambulatory Visit: Payer: Self-pay | Admitting: Family Medicine

## 2024-03-19 MED ORDER — ALENDRONATE SODIUM 70 MG PO TABS: 70.0000 mg | ORAL_TABLET | ORAL | 3 refills | Status: DC

## 2024-03-19 NOTE — Addendum Note (Signed)
 Addended by: JOHNNY SENIOR A on: 03/19/2024 07:41 AM   Modules accepted: Orders

## 2024-03-26 ENCOUNTER — Encounter: Payer: Self-pay | Admitting: Family Medicine

## 2024-03-26 ENCOUNTER — Ambulatory Visit: Admitting: Family Medicine

## 2024-03-26 VITALS — BP 136/78 | HR 72 | Temp 98.1°F | Wt 104.0 lb

## 2024-03-26 DIAGNOSIS — N390 Urinary tract infection, site not specified: Secondary | ICD-10-CM | POA: Diagnosis not present

## 2024-03-26 LAB — POC URINALSYSI DIPSTICK (AUTOMATED)
Bilirubin, UA: NEGATIVE
Blood, UA: NEGATIVE
Glucose, UA: NEGATIVE
Ketones, UA: NEGATIVE
Nitrite, UA: NEGATIVE
Protein, UA: NEGATIVE
Spec Grav, UA: <=1.005 — AB (ref 1.010–1.025)
Urobilinogen, UA: 0.2 U/dL
pH, UA: 6 (ref 5.0–8.0)

## 2024-03-26 MED ORDER — CIPROFLOXACIN HCL 500 MG PO TABS: 500.0000 mg | ORAL_TABLET | Freq: Two times a day (BID) | ORAL | 0 refills | Status: DC

## 2024-03-26 NOTE — Addendum Note (Signed)
 Addended by: LADONNA INOCENTE SAILOR on: 03/26/2024 05:00 PM   Modules accepted: Orders

## 2024-03-26 NOTE — Progress Notes (Signed)
   Subjective:    Patient ID: Brooke Combs, female    DOB: 23-Jul-1945, 78 y.o.   MRN: [id number]  HPI Here for one week of urinary burning and urgency. No fever. She drinks lots of water every day. She is using AZO with some relief.    Review of Systems  Constitutional: Negative.   Respiratory: Negative.    Cardiovascular: Negative.   Gastrointestinal: Negative.   Genitourinary:  Positive for dysuria, frequency and urgency. Negative for flank pain and hematuria.       Objective:   Physical Exam Constitutional:      Appearance: Normal appearance.  Cardiovascular:     Rate and Rhythm: Normal rate and regular rhythm.     Pulses: Normal pulses.     Heart sounds: Normal heart sounds.  Pulmonary:     Effort: Pulmonary effort is normal.     Breath sounds: Normal breath sounds.  Abdominal:     Tenderness: There is no right CVA tenderness or left CVA tenderness.  Neurological:     Mental Status: She is alert.           Assessment & Plan:  UTI, treat with 7 days of Cipro . Culture the sample.

## 2024-03-27 ENCOUNTER — Ambulatory Visit: Payer: Self-pay

## 2024-03-27 ENCOUNTER — Telehealth: Payer: Self-pay | Admitting: *Deleted

## 2024-03-27 NOTE — Telephone Encounter (Signed)
 FYI Only or Action Required?: Action required by provider: Requesting an alternative medication to treat UTI.  Patient was last seen in primary care on 03/26/2024 by Johnny Garnette LABOR, MD.  Called Nurse Triage reporting Medication Reaction.  Symptoms began yesterday.  Interventions attempted: Prescription medications: Cipro .  Symptoms are: gradually worsening.  Triage Disposition: Call PCP Now  Patient/caregiver understands and will follow disposition?: Yes        Copied from CRM [id number]. Topic: Clinical - Red Word Triage >> Mar 27, 2024  8:32 AM Brooke Combs wrote: Red Word that prompted transfer to Nurse Triage: Received call from patient, states she was recently seen in office, and treated with medication, ciprofloxacin  (CIPRO ) 500 MG tablet, for UTI.  Per patient since started medication, it is causing upset stomach, bad cramps in legs and ankles, and ankle/joint pain.   Offered patient to speak directly to Nurse Triage, patient declined, only wants to speak to nurse in office of provider.  Patient can be reached at [phone]. >> Mar 27, 2024  3:22 PM Brooke Combs wrote: Patient called in to see if someone from in the clinic responded to her concern. I let her know that no one has responded yet but the option for triage is still available. She agreed and would like to speak with triage on this matter. Reason for Disposition  [1] Caller has URGENT medicine question about med that primary care doctor (or NP/PA) or specialist prescribed AND [2] triager unable to answer question  Answer Assessment - Initial Assessment Questions Patient states her chest felt a little tight but denies pain, shortness of breath or dizziness. Patient requesting an alternative medication. Pharmacy of choice below.   [id number] [place], [place] - [phone]  [address], [place] [address]     1. NAME of MEDICINE: What medicine(s) are you calling about?     Ciprofloxacin  500 mg  2.  QUESTION: What is your question? (e.g., double dose of medicine, side effect)     Patient requesting a different medication to treat UTI symptoms.  3. PRESCRIBER: Who prescribed the medicine? Reason: if prescribed by specialist, call should be referred to that group.     PCP 4. SYMPTOMS: Do you have any symptoms? If Yes, ask: What symptoms are you having?  How bad are the symptoms (e.g., mild, moderate, severe)     Upset stomach, ankle/joint pain and cramps  Protocols used: Medication Question Call-A-AH

## 2024-03-27 NOTE — Telephone Encounter (Signed)
 Copied from CRM [id number]. Topic: Clinical - Red Word Triage >> Mar 27, 2024  8:32 AM Treva T wrote: Red Word that prompted transfer to Nurse Triage: Received call from patient, states she was recently seen in office, and treated with medication, ciprofloxacin  (CIPRO ) 500 MG tablet, for UTI.  Per patient since started medication, it is causing upset stomach, bad cramps in legs and ankles, and ankle/joint pain.   Offered patient to speak directly to Nurse Triage, patient declined, only wants to speak to nurse in office of provider.  Patient can be reached at [phone].

## 2024-03-27 NOTE — Telephone Encounter (Signed)
 Pt message sent to  PCP for advise ?

## 2024-03-27 NOTE — Telephone Encounter (Signed)
 Stop the Cipro  and call in Macrobid  100 mg BID for 7 days

## 2024-03-28 ENCOUNTER — Ambulatory Visit: Payer: Self-pay

## 2024-03-28 ENCOUNTER — Other Ambulatory Visit: Payer: Self-pay

## 2024-03-28 MED ORDER — NITROFURANTOIN MONOHYD MACRO 100 MG PO CAPS: 100.0000 mg | ORAL_CAPSULE | Freq: Two times a day (BID) | ORAL | 0 refills | Status: DC

## 2024-03-28 NOTE — Telephone Encounter (Signed)
 New Rx for Macrobid  was sent to pt pharmacy pt notified

## 2024-03-28 NOTE — Telephone Encounter (Signed)
 FYI Only or Action Required?: Action required by provider: Refusing ED, requesting new med be sent, needs call back asap, alerted [facility] to ED refusal.  Patient was last seen in primary care on 03/26/2024 by Johnny Garnette LABOR, MD.  Called Nurse Triage reporting Dizziness, Medication Reaction, Headache, prior chest pressure, Chills, Nausea, Joint Pain, legs and feet cramping, and Weakness.  Symptoms began several days ago.  Interventions attempted: OTC medications: tylenol , Rest, hydration, or home remedies, and Other: stopped taking UTI med after 1 pill.  Symptoms are: sporadic and severe, still emerging.  Triage Disposition: Go to ED Now (or PCP Triage)  Patient/caregiver understands and will follow disposition?: No, refuses disposition     Copied from CRM [id number]. Topic: Clinical - Red Word Triage >> Mar 28, 2024  8:35 AM Laymon HERO wrote: Red Word that prompted transfer to Nurse Triage: Patient feeling dizzy- due to medication from UTI Reason for Disposition  Patient sounds very sick or weak to the triager  Answer Assessment - Initial Assessment Questions 1. DESCRIPTION: Describe your dizziness.     Allergic reaction after 1 pill, right after taking pill, little bit pressure in chest no pain, both legs feet cramping really bad, got up to walk both ankles so sore could hardly walk aching in joints, quit after got med after a day, now lightheadedness, sick on my stomach, nausea/queasy no vomiting yet Not really dizzy like going to pass out, just lightheadedness, there's a difference 8. CAUSE: What do you think is causing the dizziness? (e.g., decreased fluids or food, diarrhea, emotional distress, heat exposure, new medicine, sudden standing, vomiting; unknown)     Thinks UTI med, only took 1 pill, symptoms started right after taking 10. OTHER SYMPTOMS: Do you have any other symptoms? (e.g., fever, chest pain, vomiting, diarrhea, bleeding)       No swelling Think little bit fever  yesterday, couple times got to where about to burn up, don't have a thermometer, little bit chills Headaches 6/10 and took tylenol , never have headaches No chest pain or pressure since SOB did not increase from normal Feeling weaker than usual yes ma'am absolutely   Honey I'm not going to the ED Just need to get me new med for UTI   Advised ED right away, pt refusing, pt requesting new med be sent in asap. Sending message to PCP office for call back to pt with further recommendations on symptoms of past few days with med reaction. Advised hospital asap if any worsening. Alerted [facility] to ED refusal.  Protocols used: Dizziness - Lightheadedness-A-AH

## 2024-03-28 NOTE — Telephone Encounter (Signed)
 Spoke with pt advised that Dr Johnny recommends to stop Cipro  and to start the new Rx Macrobid  sent to her pharmacy this morning

## 2024-03-29 ENCOUNTER — Ambulatory Visit: Payer: Self-pay | Admitting: Family Medicine

## 2024-03-29 LAB — URINE CULTURE
MICRO NUMBER:: 16943005
SPECIMEN QUALITY:: ADEQUATE

## 2024-04-10 ENCOUNTER — Other Ambulatory Visit: Payer: Self-pay | Admitting: Family Medicine

## 2024-04-10 NOTE — Telephone Encounter (Unsigned)
 Copied from CRM [id number]. Topic: Clinical - Medication Refill >> Apr 10, 2024  9:00 AM [place] wrote: Medication: oxyCODONE -acetaminophen  (PERCOCET) 10-325 MG tablet  Has the patient contacted their pharmacy? Yes (Agent: If no, request that the patient contact the pharmacy for the refill. If patient does not wish to contact the pharmacy document the reason why and proceed with request.) (Agent: If yes, when and what did the pharmacy advise?)  This is the patient's preferred pharmacy:  [phone], [place] - [id number] [place] [address] Phone: [phone] Fax: [phone]  Is this the correct pharmacy for this prescription? Yes If no, delete pharmacy and type the correct one.   Has the prescription been filled recently? Yes  Is the patient out of the medication? Yes  Has the patient been seen for an appointment in the last year OR does the patient have an upcoming appointment? Yes  Can we respond through MyChart? Yes  Agent: Please be advised that Rx refills may take up to 3 business days. We ask that you follow-up with your pharmacy.

## 2024-04-11 MED ORDER — OXYCODONE-ACETAMINOPHEN 10-325 MG PO TABS: 1.0000 | ORAL_TABLET | ORAL | 0 refills | Status: DC | PRN

## 2024-05-05 ENCOUNTER — Other Ambulatory Visit: Payer: Self-pay | Admitting: Family Medicine

## 2024-05-09 ENCOUNTER — Other Ambulatory Visit: Payer: Self-pay | Admitting: Family Medicine

## 2024-05-22 ENCOUNTER — Other Ambulatory Visit: Payer: Self-pay | Admitting: Family Medicine

## 2024-06-07 ENCOUNTER — Ambulatory Visit: Payer: Self-pay

## 2024-06-07 NOTE — Telephone Encounter (Signed)
 FYI Only or Action Required?: FYI only for provider: appointment scheduled on 11/24.  Patient was last seen in primary care on 03/26/2024 by Johnny Garnette LABOR, MD.  Called Nurse Triage reporting Ankle Pain.  Symptoms began about a month ago.  Interventions attempted: Rest, hydration, or home remedies.  Symptoms are: gradually improving.  Triage Disposition: See PCP Within 2 Weeks  Patient/caregiver understands and will follow disposition?: Yes  Copied from CRM [id number]. Topic: Clinical - Red Word Triage >> Jun 07, 2024  9:35 AM [name] ORN wrote: Red Word that prompted transfer to Nurse Triage: soreness in her ankles. suspect it might be a side effect of the bone medication. When she was on the medication, she was having cramping and soreness in her legs and ankles, the medication was called alendronate  (FOSAMAX ) 70 MG tablet Reason for Disposition  [1] MILD pain (e.g., does not interfere with normal activities) AND [2] present > 7 days  Answer Assessment - Initial Assessment Questions Pt prescribed Fosamax  about 2 months ago. Took for a month and would get bilateral leg ankle, and feet cramping at the same time. Occasionally had to take one of her pain pills from her sciatica it was so bad. She stopped about a month ago but has been busy with her sister in the hospital and has not been able to report the stoppage. She still has residual bilateral ankle pain. Denies fever, swelling, redness, or temp change.  Was on Calcium for 20 years but ended up getting Kidney stones so she was unable to supplement while on Fosamax .  Appt with PCP on Monday . Driving 8.4ymd to appt. ED/UC precautions understood  1. ONSET: When did the pain start?      2 months  2. LOCATION: Where is the pain located?      Bilateral  3. PAIN: How bad is the pain?  (Scale 1-10; or mild, moderate, severe)     5/10 when walking  4. WORK OR EXERCISE: Has there been any recent work or exercise that involved this part  of the body?      No injury  5. CAUSE: What do you think is causing the ankle pain?     Residual fosamax  side effect 6. OTHER SYMPTOMS: Do you have any other symptoms? (e.g., calf pain, rash, fever, swelling)  Protocols used: Ankle Pain-A-AH

## 2024-06-10 ENCOUNTER — Ambulatory Visit: Admitting: Family Medicine

## 2024-06-10 ENCOUNTER — Encounter: Payer: Self-pay | Admitting: Family Medicine

## 2024-06-10 VITALS — BP 110/60 | HR 91 | Temp 98.5°F | Wt 105.2 lb

## 2024-06-10 DIAGNOSIS — M79604 Pain in right leg: Secondary | ICD-10-CM | POA: Diagnosis not present

## 2024-06-10 DIAGNOSIS — M79605 Pain in left leg: Secondary | ICD-10-CM | POA: Diagnosis not present

## 2024-06-10 DIAGNOSIS — M8000XD Age-related osteoporosis with current pathological fracture, unspecified site, subsequent encounter for fracture with routine healing: Secondary | ICD-10-CM | POA: Diagnosis not present

## 2024-06-10 DIAGNOSIS — M810 Age-related osteoporosis without current pathological fracture: Secondary | ICD-10-CM | POA: Insufficient documentation

## 2024-06-10 MED ORDER — OXYCODONE-ACETAMINOPHEN 10-325 MG PO TABS: 1.0000 | ORAL_TABLET | ORAL | 0 refills | Status: DC | PRN

## 2024-06-10 NOTE — Progress Notes (Signed)
   Subjective:    Patient ID: Brooke Combs, female    DOB: May 25, 1946, 78 y.o.   MRN: [id number]  HPI Here to follow up on bilateral lower leg pains. She started taking Alendronate  around the end of September for osteoporosis. She has had several vertebral compression fractures. After she took 4 doses of this she began to have aching pains in both lower legs and feet. No swelling. She decided this was a side effect of the Alendronate  so she stopped taking it. Since then the pain has been slowly going away, though she still has some discomfort. She asks what the next step should be.    Review of Systems  Constitutional: Negative.   Respiratory: Negative.    Cardiovascular: Negative.   Musculoskeletal:  Positive for arthralgias.       Objective:   Physical Exam Constitutional:      Appearance: Normal appearance.  Cardiovascular:     Rate and Rhythm: Normal rate and regular rhythm.     Pulses: Normal pulses.     Heart sounds: Normal heart sounds.  Pulmonary:     Effort: Pulmonary effort is normal.     Breath sounds: Normal breath sounds.  Neurological:     Mental Status: She is alert.           Assessment & Plan:  She had bilateral lower leg pain that was likely a side effect of the Alendronate . We agreed to stay off this. We will check with her insurance company about starting her on Prolia injections.  Garnette Olmsted, MD

## 2024-06-10 NOTE — Telephone Encounter (Signed)
 Pt was seen by Dr Johnny this morning for this problem

## 2024-06-22 ENCOUNTER — Other Ambulatory Visit: Payer: Self-pay | Admitting: Family Medicine

## 2024-07-19 ENCOUNTER — Telehealth: Payer: Self-pay | Admitting: Family Medicine

## 2024-07-19 NOTE — Telephone Encounter (Signed)
 Pt has appointment on 07/23/24

## 2024-07-19 NOTE — Telephone Encounter (Signed)
 Copied from CRM #[id number]. Topic: Clinical - Refused Triage >> Jul 19, 2024  8:41 AM Mia F wrote: [name] voiced complaints of While scheduling pt follow up appt, she mentioned having swollen achy ankles. Pt was asked if her ankles were currently swollen and she said no but says they're very achy. Pt was asked if she would like to speak with NT about her ankles but declined.  . Declined transfer to triage.

## 2024-07-23 ENCOUNTER — Encounter: Payer: Self-pay | Admitting: Family Medicine

## 2024-07-23 ENCOUNTER — Ambulatory Visit (INDEPENDENT_AMBULATORY_CARE_PROVIDER_SITE_OTHER): Admitting: Family Medicine

## 2024-07-23 VITALS — BP 124/68 | HR 94 | Temp 98.8°F | Wt 109.2 lb

## 2024-07-23 DIAGNOSIS — M8000XD Age-related osteoporosis with current pathological fracture, unspecified site, subsequent encounter for fracture with routine healing: Secondary | ICD-10-CM | POA: Diagnosis not present

## 2024-07-23 DIAGNOSIS — M542 Cervicalgia: Secondary | ICD-10-CM | POA: Diagnosis not present

## 2024-07-23 MED ORDER — DENOSUMAB 60 MG/ML ~~LOC~~ SOSY: 60.0000 mg | PREFILLED_SYRINGE | Freq: Once | SUBCUTANEOUS | 0 refills | Status: AC

## 2024-07-23 NOTE — Progress Notes (Signed)
" ° °  Subjective:    Patient ID: Brooke Combs, female    DOB: 21-Apr-1946, 79 y.o.   MRN: [id number]  HPI Here for several issues. First she has had tightness and pain in the left neck that shoots up to the left skull base. No recent trauma. This started 2 weeks ago. Second she is worried that this is coming from a blockage of the arteries in her neck. Third she is ready to start on Prolia  shots to treat her osteoporosis. This was demonstrated by her DEXA on 03-13-24. She tried Alendronate , but she had to stop due to severe pains in her legs.    Review of Systems  Constitutional: Negative.   Respiratory: Negative.    Cardiovascular: Negative.   Musculoskeletal:  Positive for neck pain and neck stiffness.       Objective:   Physical Exam Constitutional:      Appearance: Normal appearance.  Cardiovascular:     Rate and Rhythm: Normal rate and regular rhythm.     Pulses: Normal pulses.     Heart sounds: Normal heart sounds.  Pulmonary:     Effort: Pulmonary effort is normal.     Breath sounds: Normal breath sounds.  Musculoskeletal:     Comments: She is tender on the left posterior neck and the left trapezius is very tight. ROM of the neck is limited by pain. She had an Xray of the cervical spine on 12-20-22 showing diffuse degeneration of the discs  Neurological:     Mental Status: She is alert.           Assessment & Plan:  She has neck pain with muscle spasms, so she can apply heat and perform gentle stretches. She has Tizanidine at home, and she can use this as needed. Recheck as needed. I reassured her that her carotid arteries are clear and that her neck pain is not related to her neck circulation. Third we will order Prolia  for her to get a shot every 6 months for the osteoporosis. Garnette Olmsted, MD   "

## 2024-07-31 ENCOUNTER — Telehealth: Payer: Self-pay

## 2024-07-31 NOTE — Telephone Encounter (Signed)
 Copied from CRM [id number]. Topic: General - Other >> Jul 31, 2024  9:16 AM Antwanette L wrote: Reason for CRM: The patient called to check whether our office received a form from [facility]. The form is required for her osteoporosis injections and to assist with coverage of the medication cost. The form needs to be completed by Dr. Johnny. I did not see the form in the system, so I provided the patient with our fax number and advised her to have [facility] the document.

## 2024-08-01 ENCOUNTER — Telehealth: Payer: Self-pay

## 2024-08-01 NOTE — Telephone Encounter (Signed)
 Copied from CRM [id number]. Topic: Clinical - Medication Question >> Aug 01, 2024  4:22 PM Thersia BROCKS wrote: Reason for CRM: chris [employer] - medication prolia  injection, would like to speak to someone in regards to this   [phone]

## 2024-08-05 MED ORDER — BILDYOS 60 MG/ML ~~LOC~~ SOSY: 60.0000 mg | PREFILLED_SYRINGE | SUBCUTANEOUS | 1 refills | Status: AC

## 2024-08-05 NOTE — Telephone Encounter (Signed)
 I sent in a RX for Buildyos instead

## 2024-08-05 NOTE — Telephone Encounter (Signed)
 FYI Pt Prolia  coverage form has been received and placed in Dr Johnny red folder for review.

## 2024-08-05 NOTE — Addendum Note (Signed)
 Addended by: JOHNNY SENIOR A on: 08/05/2024 05:27 PM   Modules accepted: Orders

## 2024-08-05 NOTE — Telephone Encounter (Signed)
 Attempted to call [place] with [employer]/ [employer] regarding pt Rx Prolia , no option to leave message. Pt form was received today and has been placed in Dr Johnny red folder.

## 2024-08-05 NOTE — Telephone Encounter (Signed)
 Pt is aware that her insurance plan does not cover Prolia  but they provided the alternative to Prolia  for Dr Johnny to prescribe

## 2024-08-06 ENCOUNTER — Telehealth: Payer: Self-pay

## 2024-08-06 MED ORDER — OXYCODONE-ACETAMINOPHEN 10-325 MG PO TABS: 1.0000 | ORAL_TABLET | ORAL | 0 refills | Status: AC | PRN

## 2024-08-06 NOTE — Telephone Encounter (Signed)
 Noted.

## 2024-08-06 NOTE — Addendum Note (Signed)
 Addended by: JOHNNY SENIOR A on: 08/06/2024 10:40 AM   Modules accepted: Orders

## 2024-08-06 NOTE — Telephone Encounter (Signed)
 Pt notified about the new Rx

## 2024-08-06 NOTE — Telephone Encounter (Signed)
 Alternative Rx listed by pt plan Denosumab -nxxp (BILDYOS ) 60 MG/ML SOSY injection  was sent to pt pharmacy. Pt notified and voiced udnerstanding

## 2024-08-06 NOTE — Telephone Encounter (Signed)
 Copied from CRM [id number]. Topic: Clinical - Prescription Issue >> Aug 06, 2024  9:59 AM Roselie BROCKS wrote: Reason for CRM: [facility] called to inform there is a issue with the  Prolia  injection it is rejecting at the pharmacy. Two alternatives are Bildyos  and Ospomv that would be approved without prior authorization  If do want to use Prolia  then a prior authorization and prior number [phone] Call [facility] [place] [phone]

## 2024-08-06 NOTE — Telephone Encounter (Signed)
 Copied from CRM [id number]. Topic: Clinical - Medication Refill >> Aug 06, 2024  8:51 AM Adelita E wrote: Medication: oxyCODONE -acetaminophen  (PERCOCET) 10-325 MG tablet  Has the patient contacted their pharmacy? No (Agent: If no, request that the patient contact the pharmacy for the refill. If patient does not wish to contact the pharmacy document the reason why and proceed with request.) (Agent: If yes, when and what did the pharmacy advise?)  This is the patient's preferred pharmacy:  [phone] [place] - [id number] [place] [address] Phone: [phone] Fax: [phone]  Is this the correct pharmacy for this prescription? Yes If no, delete pharmacy and type the correct one.   Has the prescription been filled recently? No  Is the patient out of the medication? No, 2 pills left.  Has the patient been seen for an appointment in the last year OR does the patient have an upcoming appointment? Yes  Can we respond through MyChart? Yes  Agent: Please be advised that Rx refills may take up to 3 business days. We ask that you follow-up with your pharmacy.

## 2024-08-06 NOTE — Telephone Encounter (Signed)
 Done

## 2024-08-08 NOTE — Telephone Encounter (Signed)
 Noted.

## 2024-08-08 NOTE — Telephone Encounter (Signed)
 [facility] called stating that [facility] to let her know that they will be back in contact with her in a few days to let her know if she would be able to get any assistance with paying for the denosumab -nxxp (BILDYOS ) 60 MG/ML SOSY injection
